# Patient Record
Sex: Female | Born: 1995 | Race: Black or African American | Hispanic: No | Marital: Single | Smoking: Never smoker
Health system: Southern US, Community
[De-identification: ages and names within clinical notes are randomized; demographics above are authoritative.]

## PROBLEM LIST (undated history)

## (undated) ENCOUNTER — Inpatient Hospital Stay (HOSPITAL_COMMUNITY): Payer: Self-pay

## (undated) DIAGNOSIS — Z789 Other specified health status: Secondary | ICD-10-CM

## (undated) HISTORY — PX: NO PAST SURGERIES: SHX2092

---

## 2017-12-01 ENCOUNTER — Inpatient Hospital Stay (HOSPITAL_COMMUNITY): Payer: Medicaid Other

## 2017-12-01 ENCOUNTER — Encounter (HOSPITAL_COMMUNITY): Payer: Self-pay

## 2017-12-01 ENCOUNTER — Inpatient Hospital Stay (HOSPITAL_COMMUNITY)
Admission: AD | Admit: 2017-12-01 | Discharge: 2017-12-01 | Disposition: A | Discharge status: Home / Self Care | Payer: Medicaid Other | Source: Ambulatory Visit | Attending: Obstetrics and Gynecology | Admitting: Obstetrics and Gynecology

## 2017-12-01 ENCOUNTER — Other Ambulatory Visit: Payer: Self-pay

## 2017-12-01 DIAGNOSIS — O209 Hemorrhage in early pregnancy, unspecified: Secondary | ICD-10-CM | POA: Insufficient documentation

## 2017-12-01 DIAGNOSIS — Z3A01 Less than 8 weeks gestation of pregnancy: Secondary | ICD-10-CM | POA: Diagnosis not present

## 2017-12-01 DIAGNOSIS — O200 Threatened abortion: Secondary | ICD-10-CM

## 2017-12-01 DIAGNOSIS — N939 Abnormal uterine and vaginal bleeding, unspecified: Secondary | ICD-10-CM | POA: Diagnosis present

## 2017-12-01 HISTORY — DX: Other specified health status: Z78.9

## 2017-12-01 LAB — URINALYSIS, ROUTINE W REFLEX MICROSCOPIC
BILIRUBIN URINE: NEGATIVE
Glucose, UA: NEGATIVE mg/dL
Ketones, ur: NEGATIVE mg/dL
NITRITE: NEGATIVE
PH: 5 (ref 5.0–8.0)
Protein, ur: NEGATIVE mg/dL
SPECIFIC GRAVITY, URINE: 1.025 (ref 1.005–1.030)

## 2017-12-01 LAB — HCG, QUANTITATIVE, PREGNANCY: HCG, BETA CHAIN, QUANT, S: 23384 m[IU]/mL — AB (ref <5)

## 2017-12-01 LAB — CBC
HEMATOCRIT: 38.3 % (ref 36.0–46.0)
HEMOGLOBIN: 13.3 g/dL (ref 12.0–15.0)
MCH: 31.8 pg (ref 26.0–34.0)
MCHC: 34.7 g/dL (ref 30.0–36.0)
MCV: 91.6 fL (ref 78.0–100.0)
Platelets: 345 10*3/uL (ref 150–400)
RBC: 4.18 MIL/uL (ref 3.87–5.11)
RDW: 13.9 % (ref 11.5–15.5)
WBC: 9.4 10*3/uL (ref 4.0–10.5)

## 2017-12-01 LAB — POCT PREGNANCY, URINE: Preg Test, Ur: POSITIVE — AB

## 2017-12-01 LAB — ABO/RH: ABO/RH(D): O POS

## 2017-12-01 NOTE — MAU Provider Note (Signed)
History   G1 early pregnancy in with vaginal spotting that started yesterday, denies pain. Last sex 1 week ago.  CSN: [id number]  Arrival date & time 12/01/17  1035   None     Chief Complaint  Patient presents with  . Vaginal Bleeding    HPI  Past Medical History:  Diagnosis Date  . Medical history non-contributory     Past Surgical History:  Procedure Laterality Date  . NO PAST SURGERIES      Family History  Problem Relation Age of Onset  . Cancer Mother     Social History   Tobacco Use  . Smoking status: Never Smoker  . Smokeless tobacco: Never Used  Substance Use Topics  . Alcohol use: No    Frequency: Never  . Drug use: No    OB History    Gravida Para Term Preterm AB Living   1             SAB TAB Ectopic Multiple Live Births                  Review of Systems  Constitutional: Negative.   HENT: Negative.   Eyes: Negative.   Respiratory: Negative.   Gastrointestinal: Negative.   Endocrine: Negative.   Genitourinary: Positive for vaginal bleeding.  Skin: Negative.   Allergic/Immunologic: Negative.   Neurological: Negative.   Hematological: Negative.   Psychiatric/Behavioral: Negative.     Allergies  Patient has no known allergies.  Home Medications    There were no vitals taken for this visit.  Physical Exam  Constitutional: She is oriented to person, place, and time. She appears well-developed and well-nourished.  HENT:  Head: Normocephalic.  Eyes: Pupils are equal, round, and reactive to light.  Cardiovascular: Normal rate, regular rhythm, normal heart sounds and intact distal pulses.  Pulmonary/Chest: Effort normal and breath sounds normal.  Abdominal: Soft. Bowel sounds are normal.  Genitourinary: Vagina normal and uterus normal.  Musculoskeletal: Normal range of motion.  Neurological: She is alert and oriented to person, place, and time. She has normal reflexes.  Skin: Skin is warm and dry.  Psychiatric: She has a normal mood  and affect. Her behavior is normal. Judgment and thought content normal.    MAU Course  Procedures (including critical care time)  Labs Reviewed  URINALYSIS, ROUTINE W REFLEX MICROSCOPIC - Abnormal; Notable for the following components:      Result Value   APPearance HAZY (*)    Hgb urine dipstick SMALL (*)    Leukocytes, UA SMALL (*)    Bacteria, UA RARE (*)    Squamous Epithelial / LPF 0-5 (*)    All other components within normal limits  HCG, QUANTITATIVE, PREGNANCY - Abnormal; Notable for the following components:   hCG, Beta Chain, Quant, S 23,384 (*)    All other components within normal limits  POCT PREGNANCY, URINE - Abnormal; Notable for the following components:   Preg Test, Ur POSITIVE (*)    All other components within normal limits  CBC  ABO/RH   No results found.   1. Bleeding in early pregnancy       MDM  VSS, spotty amt dark brown bleeding. Pelvic rest. U/s shows viable IUP. Will d/c home with precautions

## 2017-12-01 NOTE — MAU Note (Addendum)
G1 early pregnant. Presents to triage for spot bleeding that started yesterday  Last intercourse: last week  Last LMP unsure: 10/25/17. Date approx by health dept.  [facility]   1138: New orders received. lab at [facility]  1430: d/c instructions given with pt understanding. Pt left unit via ambulatory

## 2017-12-01 NOTE — Discharge Instructions (Signed)
Vaginal Bleeding During Pregnancy, First Trimester °A small amount of bleeding (spotting) from the vagina is common in early pregnancy. Sometimes the bleeding is normal and is not a problem, and sometimes it is a sign of something serious. Be sure to tell your doctor about any bleeding from your vagina right away. °Follow these instructions at home: °· Watch your condition for any changes. °· Follow your doctor's instructions about how active you can be. °· If you are on bed rest: °? You may need to stay in bed and only get up to use the bathroom. °? You may be allowed to do some activities. °? If you need help, make plans for someone to help you. °· Write down: °? The number of pads you use each day. °? How often you change pads. °? How soaked (saturated) your pads are. °· Do not use tampons. °· Do not douche. °· Do not have sex or orgasms until your doctor says it is okay. °· If you pass any tissue from your vagina, save the tissue so you can show it to your doctor. °· Only take medicines as told by your doctor. °· Do not take aspirin because it can make you bleed. °· Keep all follow-up visits as told by your doctor. °Contact a doctor if: °· You bleed from your vagina. °· You have cramps. °· You have labor pains. °· You have a fever that does not go away after you take medicine. °Get help right away if: °· You have very bad cramps in your back or belly (abdomen). °· You pass large clots or tissue from your vagina. °· You bleed more. °· You feel light-headed or weak. °· You pass out (faint). °· You have chills. °· You are leaking fluid or have a gush of fluid from your vagina. °· You pass out while pooping (having a bowel movement). °This information is not intended to replace advice given to you by your health care provider. Make sure you discuss any questions you have with your health care provider. °Document Released: 01/25/2014 Document Revised: 02/16/2016 Document Reviewed: 05/18/2013 °Elsevier Interactive  Patient Education © 2018 Elsevier Inc. ° °

## 2017-12-10 ENCOUNTER — Inpatient Hospital Stay (HOSPITAL_COMMUNITY)
Admission: AD | Admit: 2017-12-10 | Discharge: 2017-12-10 | Disposition: A | Discharge status: Home / Self Care | Payer: Medicaid Other | Source: Ambulatory Visit | Attending: Obstetrics & Gynecology | Admitting: Obstetrics & Gynecology

## 2017-12-10 ENCOUNTER — Encounter: Payer: Self-pay | Admitting: *Deleted

## 2017-12-10 ENCOUNTER — Other Ambulatory Visit: Payer: Self-pay

## 2017-12-10 ENCOUNTER — Encounter (HOSPITAL_COMMUNITY): Payer: Self-pay

## 2017-12-10 DIAGNOSIS — Z79899 Other long term (current) drug therapy: Secondary | ICD-10-CM | POA: Insufficient documentation

## 2017-12-10 DIAGNOSIS — Z3A01 Less than 8 weeks gestation of pregnancy: Secondary | ICD-10-CM

## 2017-12-10 DIAGNOSIS — O219 Vomiting of pregnancy, unspecified: Secondary | ICD-10-CM

## 2017-12-10 DIAGNOSIS — Z809 Family history of malignant neoplasm, unspecified: Secondary | ICD-10-CM | POA: Insufficient documentation

## 2017-12-10 LAB — URINALYSIS, ROUTINE W REFLEX MICROSCOPIC
BACTERIA UA: NONE SEEN
Bilirubin Urine: NEGATIVE
Glucose, UA: NEGATIVE mg/dL
Hgb urine dipstick: NEGATIVE
KETONES UR: NEGATIVE mg/dL
Nitrite: NEGATIVE
PH: 6 (ref 5.0–8.0)
PROTEIN: NEGATIVE mg/dL
Specific Gravity, Urine: 1.01 (ref 1.005–1.030)

## 2017-12-10 MED ORDER — ONDANSETRON 8 MG PO TBDP
8.0000 mg | ORAL_TABLET | Freq: Once | ORAL | Status: AC
  Administered 2017-12-10: 8 mg via ORAL
  Filled 2017-12-10: qty 1

## 2017-12-10 MED ORDER — ONDANSETRON 4 MG PO TBDP: 4.0000 mg | ORAL_TABLET | Freq: Three times a day (TID) | ORAL | 0 refills | Status: DC | PRN

## 2017-12-10 NOTE — Discharge Instructions (Signed)

## 2017-12-10 NOTE — MAU Provider Note (Addendum)
Patient Caroline Gutierrez is a 22 y.o. G1P0 At 8324w4d here with complaints of nausea  that has been on-going and now a recent bout of vomiting last night at 10 pm. She threw up 3-4 times, all in a row after eating a hot dog at 10:01pm.  She has a documented IUP on ultrasound from 12-01-2017/  History     CSN: [id number]  Arrival date and time: 12/10/17 [id number]  Chief Complaint  Patient presents with  . Emesis  . Nausea   Emesis   This is a new problem. The current episode started yesterday. The problem occurs 2 to 4 times per day. The problem has been resolved (last time she threw up was 10pm. ). The emesis has an appearance of stomach contents. There has been no fever. Pertinent negatives include no abdominal pain, chills, coughing, diarrhea, fever or headaches. Treatments tried: tried ginger ale but it didn't help.    OB History    Gravida Para Term Preterm AB Living   1             SAB TAB Ectopic Multiple Live Births                  Past Medical History:  Diagnosis Date  . Medical history non-contributory     Past Surgical History:  Procedure Laterality Date  . NO PAST SURGERIES      Family History  Problem Relation Age of Onset  . Cancer Mother     Social History   Tobacco Use  . Smoking status: Never Smoker  . Smokeless tobacco: Never Used  Substance Use Topics  . Alcohol use: No    Frequency: Never  . Drug use: No    Allergies: No Known Allergies  Medications Prior to Admission  Medication Sig Dispense Refill Last Dose  . Prenatal Vit-Fe Fumarate-FA (PRENATAL MULTIVITAMIN) TABS tablet Take 1 tablet by mouth daily at 12 noon.   Past Week at Unknown time    Review of Systems  Constitutional: Negative for chills and fever.  HENT: Negative.   Respiratory: Negative.  Negative for cough.   Cardiovascular: Negative.   Gastrointestinal: Positive for nausea and vomiting. Negative for abdominal pain and diarrhea.  Genitourinary: Negative.  Negative for difficulty  urinating and pelvic pain.  Neurological: Negative.  Negative for headaches.  Psychiatric/Behavioral: Negative.    Physical Exam   Blood pressure 98/61, pulse 67, temperature 99.2 F (37.3 C), temperature source Oral, resp. rate 16, height 5\' 4"  (1.626 m), weight 102 lb (46.3 kg), last menstrual period 10/02/2017, SpO2 100 %.  Physical Exam  Constitutional: She is oriented to person, place, and time. She appears well-developed.  HENT:  Head: Normocephalic.  Neck: Normal range of motion.  Cardiovascular: Normal rate.  Respiratory: Effort normal. No respiratory distress.  GI: Soft. She exhibits no distension and no mass. There is no tenderness. There is no rebound and no guarding.  Musculoskeletal: Normal range of motion.  Neurological: She is alert and oriented to person, place, and time. She has normal reflexes.  Skin: Skin is warm and dry.   Results for orders placed or performed during the hospital encounter of 12/10/17 (from the past 24 hour(s))  Urinalysis, Routine w reflex microscopic     Status: Abnormal   Collection Time: 12/10/17  8:00 AM  Result Value Ref Range   Color, Urine YELLOW YELLOW   APPearance CLEAR CLEAR   Specific Gravity, Urine 1.010 1.005 - 1.030   pH 6.0 5.0 -  8.0   Glucose, UA NEGATIVE NEGATIVE mg/dL   Hgb urine dipstick NEGATIVE NEGATIVE   Bilirubin Urine NEGATIVE NEGATIVE   Ketones, ur NEGATIVE NEGATIVE mg/dL   Protein, ur NEGATIVE NEGATIVE mg/dL   Nitrite NEGATIVE NEGATIVE   Leukocytes, UA SMALL (A) NEGATIVE   RBC / HPF 0-5 0 - 5 RBC/hpf   WBC, UA 0-5 0 - 5 WBC/hpf   Bacteria, UA NONE SEEN NONE SEEN   Squamous Epithelial / LPF 0-5 (A) NONE SEEN   Mucus PRESENT    MAU Course  Procedures  MDM -CMP: not done as patient has not had excessive vomiting, thus hypokalemia unlikely. -Zofran ODT.  She says that she doesn't have a lot of food in her house because she doesn't have her food stamps so she has been limiting her food intake. Patient knows of  a food pantry, she will go with her boyfriend's mom.   Charlesetta Garibaldi Kooistra 12/10/2017, 7:47 AM   Tolerating po. No emesis. Stable for discharge home.  Assessment and Plan   1. [redacted] weeks gestation of pregnancy   2. Nausea/vomiting in pregnancy    Discharge home Follow up with OB provider of choice to start care Rx Zofran  Allergies as of 12/10/2017   No Known Allergies     Medication List    TAKE these medications   ondansetron 4 MG disintegrating tablet Commonly known as:  ZOFRAN ODT Take 1 tablet (4 mg total) by mouth every 8 (eight) hours as needed for nausea or vomiting.   prenatal multivitamin Tabs tablet Take 1 tablet by mouth daily at 12 noon.      Donette Larry, CNM  12/10/2017 9:21 AM

## 2017-12-10 NOTE — MAU Note (Signed)
Pt reports nasuea since positive preg, vomiting off/on. Dizzy at times. Last time she vomited was last pm

## 2017-12-10 NOTE — MAU Note (Signed)
Pt. Reports no nausea/vomiting.

## 2017-12-10 NOTE — MAU Note (Signed)
Pt. Given crackers and drink, tolerated well, no emesis. Need referrals for OB care.

## 2018-01-07 ENCOUNTER — Encounter: Payer: Self-pay | Admitting: *Deleted

## 2018-01-07 ENCOUNTER — Ambulatory Visit (INDEPENDENT_AMBULATORY_CARE_PROVIDER_SITE_OTHER): Payer: Medicaid Other

## 2018-01-07 ENCOUNTER — Other Ambulatory Visit (HOSPITAL_COMMUNITY)
Admission: RE | Admit: 2018-01-07 | Discharge: 2018-01-07 | Disposition: A | Discharge status: Home / Self Care | Payer: Medicaid Other | Source: Ambulatory Visit | Attending: Obstetrics and Gynecology | Admitting: Obstetrics and Gynecology

## 2018-01-07 VITALS — BP 103/68 | HR 68 | Wt 109.0 lb

## 2018-01-07 DIAGNOSIS — Z3490 Encounter for supervision of normal pregnancy, unspecified, unspecified trimester: Secondary | ICD-10-CM | POA: Insufficient documentation

## 2018-01-07 DIAGNOSIS — Z23 Encounter for immunization: Secondary | ICD-10-CM

## 2018-01-07 LAB — POCT URINALYSIS DIP (DEVICE)
BILIRUBIN URINE: NEGATIVE
Glucose, UA: NEGATIVE mg/dL
HGB URINE DIPSTICK: NEGATIVE
KETONES UR: NEGATIVE mg/dL
Nitrite: NEGATIVE
PH: 6 (ref 5.0–8.0)
Protein, ur: NEGATIVE mg/dL
Specific Gravity, Urine: 1.025 (ref 1.005–1.030)
Urobilinogen, UA: 0.2 mg/dL (ref 0.0–1.0)

## 2018-01-07 NOTE — Progress Notes (Addendum)
New OB packet given  Flu Vaccine given  Anatomy OB US scheduled June 11th @ 1015.  Pt notified.  [facility]

## 2018-01-08 LAB — GC/CHLAMYDIA PROBE AMP (~~LOC~~) NOT AT ARMC
Chlamydia: NEGATIVE
NEISSERIA GONORRHEA: NEGATIVE

## 2018-01-09 LAB — CULTURE, OB URINE

## 2018-01-09 LAB — URINE CULTURE, OB REFLEX

## 2018-01-14 ENCOUNTER — Ambulatory Visit (INDEPENDENT_AMBULATORY_CARE_PROVIDER_SITE_OTHER): Payer: Medicaid Other | Admitting: Obstetrics and Gynecology

## 2018-01-14 ENCOUNTER — Encounter: Payer: Self-pay | Admitting: Obstetrics and Gynecology

## 2018-01-14 ENCOUNTER — Other Ambulatory Visit (HOSPITAL_COMMUNITY)
Admission: RE | Admit: 2018-01-14 | Discharge: 2018-01-14 | Disposition: A | Discharge status: Home / Self Care | Payer: Medicaid Other | Source: Ambulatory Visit | Attending: Obstetrics and Gynecology | Admitting: Obstetrics and Gynecology

## 2018-01-14 VITALS — BP 117/70 | HR 74 | Wt 113.9 lb

## 2018-01-14 DIAGNOSIS — Z3490 Encounter for supervision of normal pregnancy, unspecified, unspecified trimester: Secondary | ICD-10-CM | POA: Diagnosis present

## 2018-01-14 DIAGNOSIS — Z3A00 Weeks of gestation of pregnancy not specified: Secondary | ICD-10-CM | POA: Diagnosis not present

## 2018-01-14 DIAGNOSIS — B373 Candidiasis of vulva and vagina: Secondary | ICD-10-CM

## 2018-01-14 DIAGNOSIS — B3731 Acute candidiasis of vulva and vagina: Secondary | ICD-10-CM

## 2018-01-14 DIAGNOSIS — Z3401 Encounter for supervision of normal first pregnancy, first trimester: Secondary | ICD-10-CM

## 2018-01-14 MED ORDER — MICONAZOLE NITRATE 2 % VA CREA: 1.0000 | TOPICAL_CREAM | Freq: Every day | VAGINAL | 0 refills | Status: DC

## 2018-01-14 MED ORDER — PRENATAL VITAMINS 0.8 MG PO TABS: 1.0000 | ORAL_TABLET | Freq: Every day | ORAL | 12 refills | Status: DC

## 2018-01-14 NOTE — Progress Notes (Signed)
New OB Note  01/14/2018   CC:  Chief Complaint  Patient presents with  . Initial Prenatal Visit    Transfer of Care Patient: no  History of Present Illness: Caroline Gutierrez is a 22 y.o. G1P0000 at [redacted]w[redacted]d by early ultrasound being seen today for her first obstetrical visit. Her obstetrical history is significant for nothing. This was a unplanned pregnancy. Relationship with FOB: in a relationship; living together. Patient does intend to breast feed. Patient plans unsure for contraception after completion of pregnancy. Pregnancy history fully reviewed.  Her periods were: Regular  She was using no method when she conceived.  She has Negative signs or symptoms of nausea/vomiting of pregnancy. She has Negative signs or symptoms of miscarriage or preterm labor No recent travel.  Patient reports no complaints.   ROS: A 12-point review of systems was performed and negative, except as stated in the above HPI.   OBGYN History: OB History  Gravida Para Term Preterm AB Living  1 0 0 0 0 0  SAB TAB Ectopic Multiple Live Births  0 0 0 0 0    # Outcome Date GA Lbr Len/2nd Weight Sex Delivery Anes PTL Lv  1 Current             GYN Hx: Never had a pap smear  Past Medical History: Past Medical History:  Diagnosis Date  . Medical history non-contributory     Past Surgical History: Past Surgical History:  Procedure Laterality Date  . NO PAST SURGERIES      Family History:  Family History  Problem Relation Age of Onset  . Cancer Mother     Social History:  Social History   Socioeconomic History  . Marital status: Single    Spouse name: Not on file  . Number of children: Not on file  . Years of education: Not on file  . Highest education level: Not on file  Occupational History  . Not on file  Social Needs  . Financial resource strain: Not on file  . Food insecurity:    Worry: Not on file    Inability: Not on file  . Transportation needs:    Medical: Not on  file    Non-medical: Not on file  Tobacco Use  . Smoking status: Never Smoker  . Smokeless tobacco: Never Used  Substance and Sexual Activity  . Alcohol use: No    Frequency: Never  . Drug use: No  . Sexual activity: Not Currently    Birth control/protection: None  Lifestyle  . Physical activity:    Days per week: Not on file    Minutes per session: Not on file  . Stress: Not on file  Relationships  . Social connections:    Talks on phone: Not on file    Gets together: Not on file    Attends religious service: Not on file    Active member of club or organization: Not on file    Attends meetings of clubs or organizations: Not on file    Relationship status: Not on file  . Intimate partner violence:    Fear of current or ex partner: Not on file    Emotionally abused: Not on file    Physically abused: Not on file    Forced sexual activity: Not on file  Other Topics Concern  . Not on file  Social History Narrative  . Not on file    Allergy: No Known Allergies  Current Outpatient  Medications:  Current Outpatient Medications:  .  Prenatal Vit-Fe Fumarate-FA (PRENATAL MULTIVITAMIN) TABS tablet, Take 1 tablet by mouth daily at 12 noon., Disp: , Rfl:  .  ondansetron (ZOFRAN ODT) 4 MG disintegrating tablet, Take 1 tablet (4 mg total) by mouth every 8 (eight) hours as needed for nausea or vomiting. (Patient not taking: Reported on 01/07/2018), Disp: 20 tablet, Rfl: 0  Physical Exam:   BP 117/70   Pulse 74   Wt 113 lb 14.4 oz (51.7 kg)   LMP 10/02/2017   BMI 19.55 kg/m  Body mass index is 19.55 kg/m. Contractions: Not present Vag. Bleeding: None. FHTs: 154  General appearance: Well nourished, well developed female in no acute distress.  Neck:  Supple, normal appearance, and no thyromegaly  Cardiovascular: regular rate and rhythm Respiratory:  Clear to auscultation bilateral. Normal respiratory effort Abdomen: positive bowel sounds and no masses, hernias; diffusely non  tender to palpation, non distended Breasts: breasts appear normal, no suspicious masses, no skin or nipple changes or axillary nodes. Genitalia:  Normal introitus for age, no external lesions, thick clumpy whit vaginal discharge, mucosa pink and moist, no vaginal or cervical lesions, no vaginal atrophy, friaility of cervix, no hemorrhage, normal uterus size and position, no adnexal masses or tenderness Neuro/Psych:  Normal mood and affect.  Skin:  Warm and dry.  Lymphatic:  No inguinal lymphadenopathy.    Assessment/Plan: G1P0000 7981w0d  1. Encounter for supervision of low-risk pregnancy, antepartum Doing well. Reviewed initial labs that patient had drawn prior to encounter - normal. Genetic testing results pending. Rx for prenatal vitamins.  - Cytology - PAP  2. Candida vaginitis Diagnosed by exam. Will treat with Miconazole.   Ultrasound discussed; fetal anatomic survey: ordered. Problem list reviewed and updated. The nature of [place] [facility] with multiple MDs and other Advanced Practice Providers was explained to patient; also emphasized that residents, students are part of our team. Routine obstetric precautions reviewed. Return in about 1 month (around 02/11/2018) for ob visit.    Caryl AdaJazma Clelia Trabucco, DO OB Fellow [facility]

## 2018-01-14 NOTE — Patient Instructions (Signed)
First Trimester of Pregnancy The first trimester of pregnancy is from week 1 until the end of week 13 (months 1 through 3). During this time, your baby will begin to develop inside you. At 6-8 weeks, the eyes and face are formed, and the heartbeat can be seen on ultrasound. At the end of 12 weeks, all the baby's organs are formed. Prenatal care is all the medical care you receive before the birth of your baby. Make sure you get good prenatal care and follow all of your doctor's instructions. Follow these instructions at home: Medicines  Take over-the-counter and prescription medicines only as told by your doctor. Some medicines are safe and some medicines are not safe during pregnancy.  Take a prenatal vitamin that contains at least 600 micrograms (mcg) of folic acid.  If you have trouble pooping (constipation), take medicine that will make your stool soft (stool softener) if your doctor approves. Eating and drinking  Eat regular, healthy meals.  Your doctor will tell you the amount of weight gain that is right for you.  Avoid raw meat and uncooked cheese.  If you feel sick to your stomach (nauseous) or throw up (vomit): ? Eat 4 or 5 small meals a day instead of 3 large meals. ? Try eating a few soda crackers. ? Drink liquids between meals instead of during meals.  To prevent constipation: ? Eat foods that are high in fiber, like fresh fruits and vegetables, whole grains, and beans. ? Drink enough fluids to keep your pee (urine) clear or pale yellow. Activity  Exercise only as told by your doctor. Stop exercising if you have cramps or pain in your lower belly (abdomen) or low back.  Do not exercise if it is too hot, too humid, or if you are in a place of great height (high altitude).  Try to avoid standing for long periods of time. Move your legs often if you must stand in one place for a long time.  Avoid heavy lifting.  Wear low-heeled shoes. Sit and stand up straight.  You  can have sex unless your doctor tells you not to. Relieving pain and discomfort  Wear a good support bra if your breasts are sore.  Take warm water baths (sitz baths) to soothe pain or discomfort caused by hemorrhoids. Use hemorrhoid cream if your doctor says it is okay.  Rest with your legs raised if you have leg cramps or low back pain.  If you have puffy, bulging veins (varicose veins) in your legs: ? Wear support hose or compression stockings as told by your doctor. ? Raise (elevate) your feet for 15 minutes, 3-4 times a day. ? Limit salt in your food. Prenatal care  Schedule your prenatal visits by the twelfth week of pregnancy.  Write down your questions. Take them to your prenatal visits.  Keep all your prenatal visits as told by your doctor. This is important. Safety  Wear your seat belt at all times when driving.  Make a list of emergency phone numbers. The list should include numbers for family, friends, the hospital, and police and fire departments. General instructions  Ask your doctor for a referral to a local prenatal class. Begin classes no later than at the start of month 6 of your pregnancy.  Ask for help if you need counseling or if you need help with nutrition. Your doctor can give you advice or tell you where to go for help.  Do not use hot tubs, steam rooms, or   saunas.  Do not douche or use tampons or scented sanitary pads.  Do not cross your legs for long periods of time.  Avoid all herbs and alcohol. Avoid drugs that are not approved by your doctor.  Do not use any tobacco products, including cigarettes, chewing tobacco, and electronic cigarettes. If you need help quitting, ask your doctor. You may get counseling or other support to help you quit.  Avoid cat litter boxes and soil used by cats. These carry germs that can cause birth defects in the baby and can cause a loss of your baby (miscarriage) or stillbirth.  Visit your dentist. At home, brush  your teeth with a soft toothbrush. Be gentle when you floss. Contact a doctor if:  You are dizzy.  You have mild cramps or pressure in your lower belly.  You have a nagging pain in your belly area.  You continue to feel sick to your stomach, you throw up, or you have watery poop (diarrhea).  You have a bad smelling fluid coming from your vagina.  You have pain when you pee (urinate).  You have increased puffiness (swelling) in your face, hands, legs, or ankles. Get help right away if:  You have a fever.  You are leaking fluid from your vagina.  You have spotting or bleeding from your vagina.  You have very bad belly cramping or pain.  You gain or lose weight rapidly.  You throw up blood. It may look like coffee grounds.  You are around people who have German measles, fifth disease, or chickenpox.  You have a very bad headache.  You have shortness of breath.  You have any kind of trauma, such as from a fall or a car accident. Summary  The first trimester of pregnancy is from week 1 until the end of week 13 (months 1 through 3).  To take care of yourself and your unborn baby, you will need to eat healthy meals, take medicines only if your doctor tells you to do so, and do activities that are safe for you and your baby.  Keep all follow-up visits as told by your doctor. This is important as your doctor will have to ensure that your baby is healthy and growing well. This information is not intended to replace advice given to you by your health care provider. Make sure you discuss any questions you have with your health care provider. Document Released: 02/27/2008 Document Revised: 09/18/2016 Document Reviewed: 09/18/2016 Elsevier Interactive Patient Education  2017 Elsevier Inc.  

## 2018-01-15 LAB — HEMOGLOBINOPATHY EVALUATION
Ferritin: 36 ng/mL (ref 15–150)
HGB A: 97.8 % (ref 96.4–98.8)
HGB F QUANT: 0 % (ref 0.0–2.0)
HGB S: 0 %
Hgb A2 Quant: 2.2 % (ref 1.8–3.2)
Hgb C: 0 %
Hgb Solubility: NEGATIVE
Hgb Variant: 0 %

## 2018-01-15 LAB — OBSTETRIC PANEL, INCLUDING HIV
ANTIBODY SCREEN: NEGATIVE
BASOS: 0 %
Basophils Absolute: 0 10*3/uL (ref 0.0–0.2)
EOS (ABSOLUTE): 1.3 10*3/uL — ABNORMAL HIGH (ref 0.0–0.4)
EOS: 10 %
HEMATOCRIT: 38.6 % (ref 34.0–46.6)
HEMOGLOBIN: 13.3 g/dL (ref 11.1–15.9)
HIV Screen 4th Generation wRfx: NONREACTIVE
Hepatitis B Surface Ag: NEGATIVE
IMMATURE GRANULOCYTES: 0 %
Immature Grans (Abs): 0 10*3/uL (ref 0.0–0.1)
LYMPHS ABS: 2.2 10*3/uL (ref 0.7–3.1)
Lymphs: 16 %
MCH: 32.9 pg (ref 26.6–33.0)
MCHC: 34.5 g/dL (ref 31.5–35.7)
MCV: 96 fL (ref 79–97)
Monocytes Absolute: 0.9 10*3/uL (ref 0.1–0.9)
Monocytes: 7 %
NEUTROS ABS: 9 10*3/uL — AB (ref 1.4–7.0)
NEUTROS PCT: 67 %
Platelets: 374 10*3/uL (ref 150–379)
RBC: 4.04 x10E6/uL (ref 3.77–5.28)
RDW: 14.4 % (ref 12.3–15.4)
RH TYPE: POSITIVE
RPR: NONREACTIVE
Rubella Antibodies, IGG: 3.62 index (ref >0.99)
WBC: 13.4 10*3/uL — AB (ref 3.4–10.8)

## 2018-01-15 LAB — SMN1 COPY NUMBER ANALYSIS (SMA CARRIER SCREENING)

## 2018-01-15 LAB — CYSTIC FIBROSIS GENE TEST

## 2018-01-16 LAB — CYTOLOGY - PAP
CHLAMYDIA, DNA PROBE: NEGATIVE
Diagnosis: NEGATIVE
NEISSERIA GONORRHEA: NEGATIVE

## 2018-02-11 ENCOUNTER — Encounter: Payer: Medicaid Other | Admitting: Obstetrics and Gynecology

## 2018-02-13 ENCOUNTER — Encounter: Payer: Medicaid Other | Admitting: Student

## 2018-03-04 ENCOUNTER — Other Ambulatory Visit (HOSPITAL_COMMUNITY): Payer: Medicaid Other

## 2018-10-21 ENCOUNTER — Encounter (HOSPITAL_COMMUNITY): Payer: Self-pay

## 2019-11-05 IMAGING — US US OB < 14 WEEKS - US OB TV
1 series · 15 of 28 positions shown · non-contrast
Comparison: None.

CLINICAL DATA: Bleeding in early pregnancy. Quantitative beta HCG
level is [DATE]. Patient is 5 weeks and 2 days pregnant based on her
last menstrual period.

EXAM:
OBSTETRIC <14 WK US AND TRANSVAGINAL OB US
TECHNIQUE: Both transabdominal and transvaginal ultrasound examinations were
performed for complete evaluation of the gestation as well as the
maternal uterus, adnexal regions, and pelvic cul-de-sac.
Transvaginal technique was performed to assess early pregnancy.

[Series 1: us ob < 14 weeks - us ob tv · 15 of 36 slices shown]
[im 1/36]
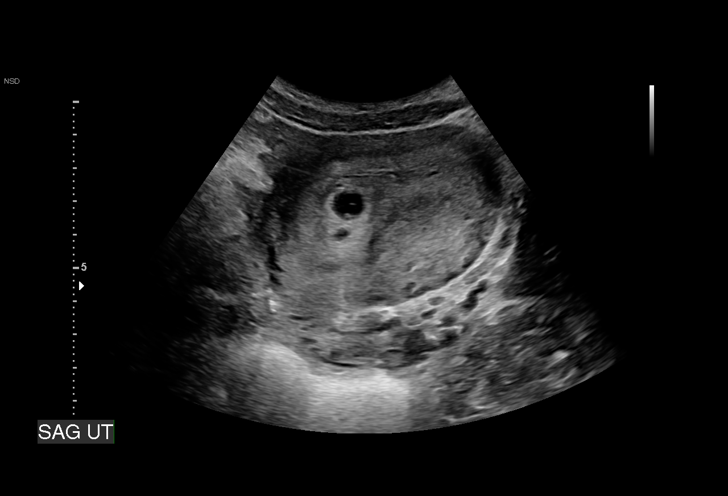
[im 3/36]
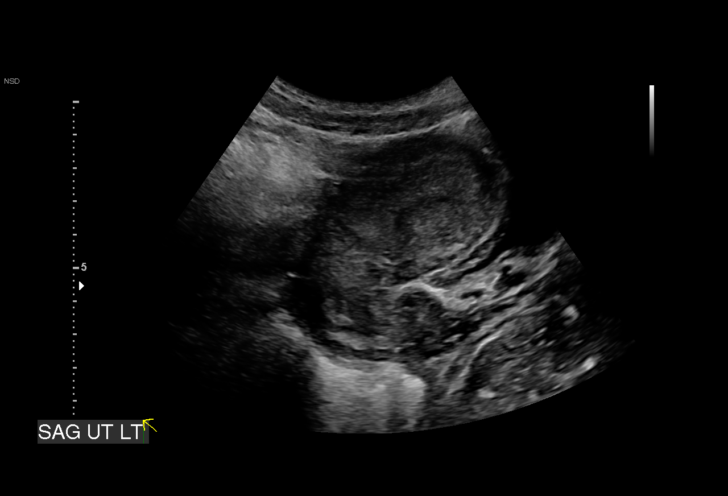
[im 6/36]
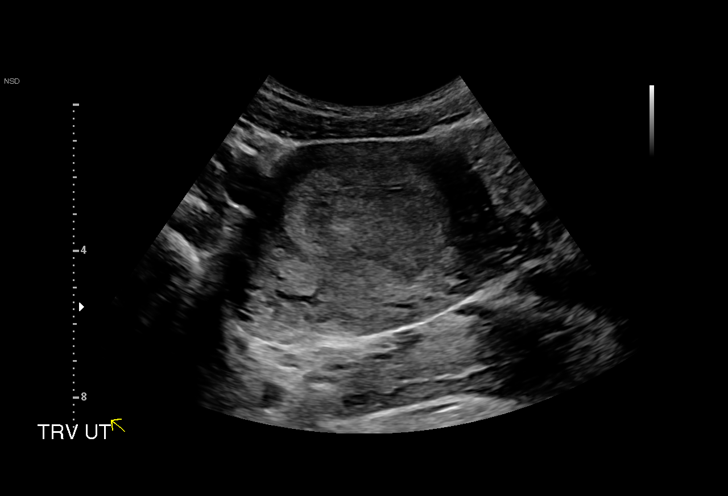
[im 8/36]
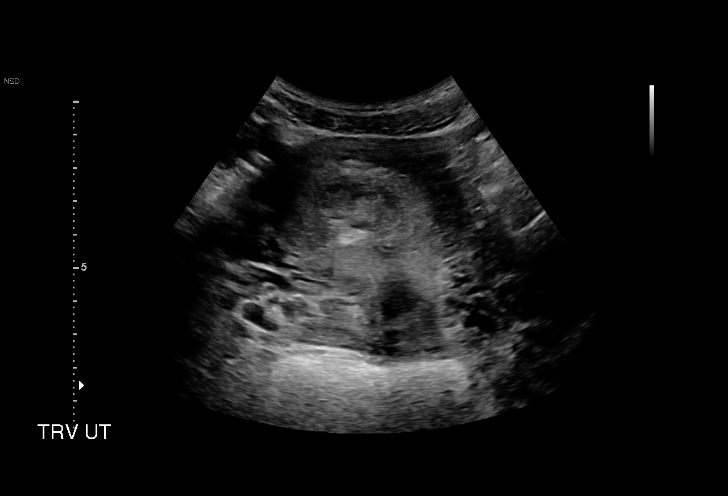
[im 11/36]
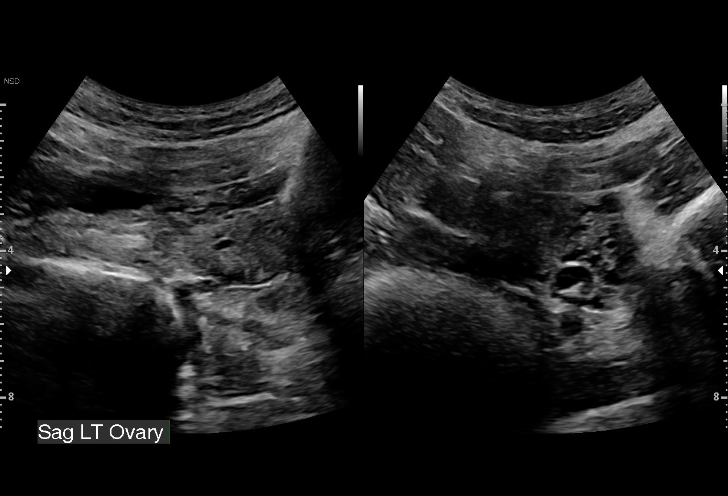
[im 13/36]
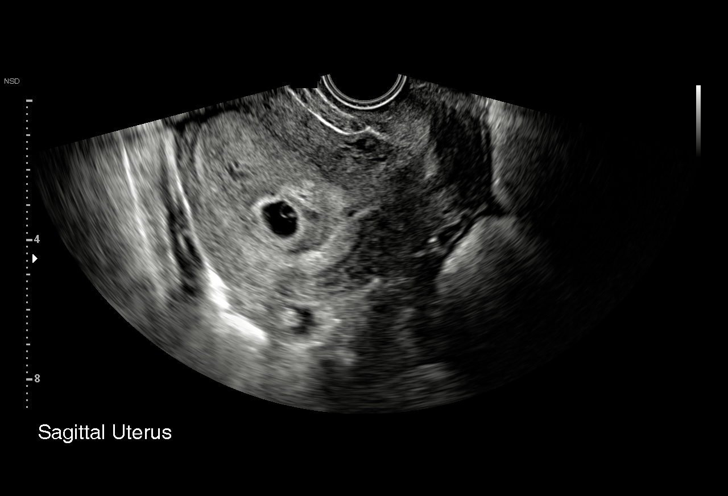
[im 16/36]
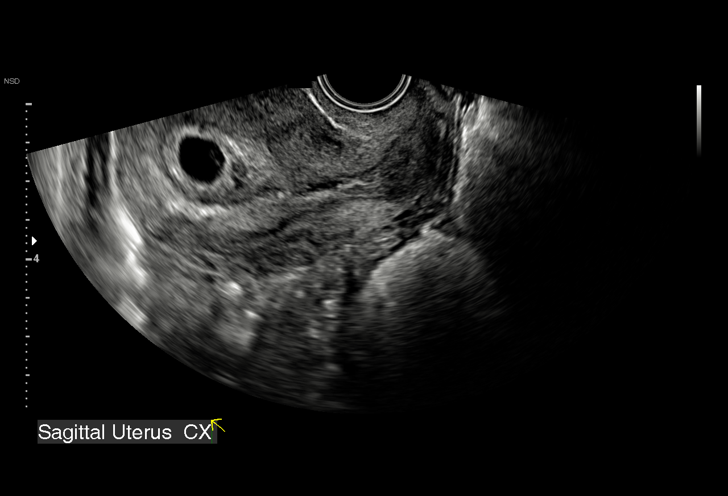
[im 19/36]
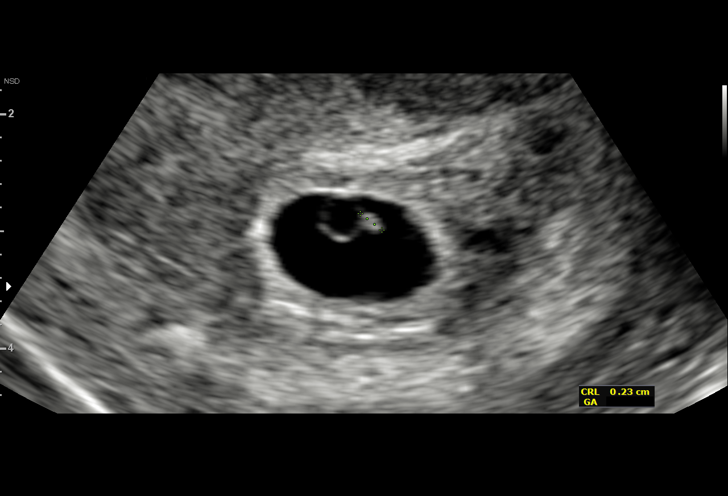
[im 20/36]
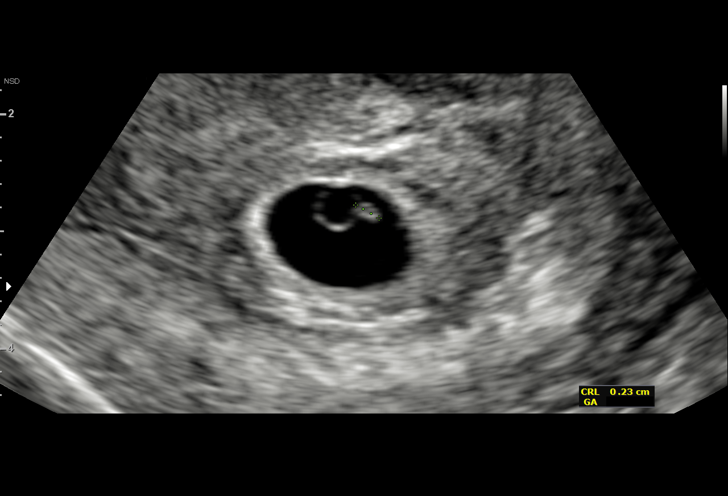
[im 23/36]
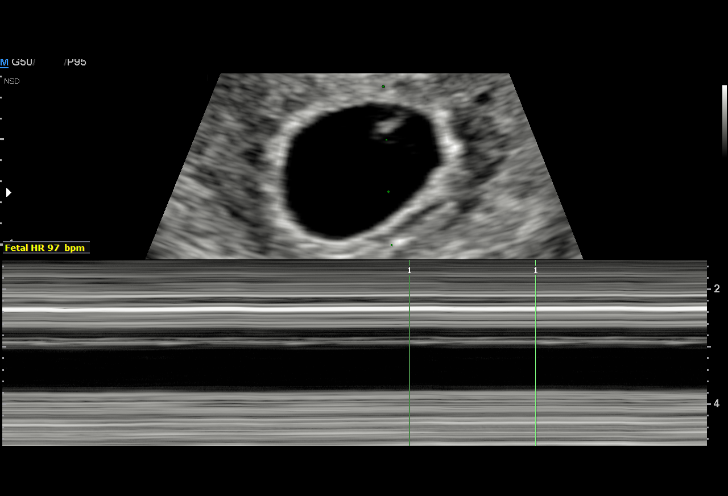
[im 25/36]
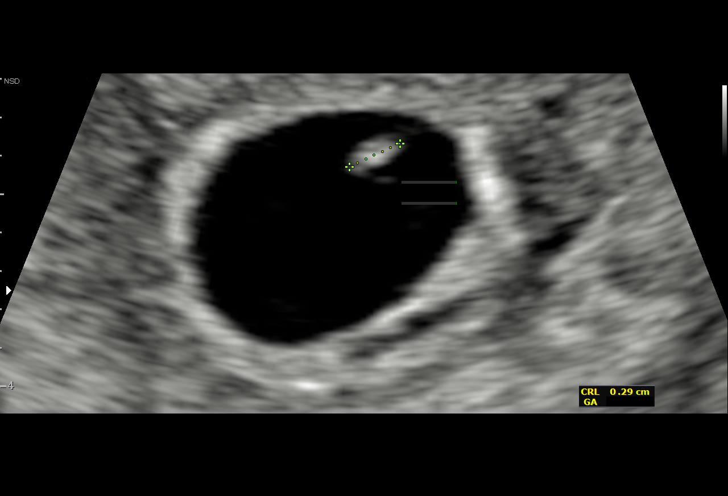
[im 28/36]
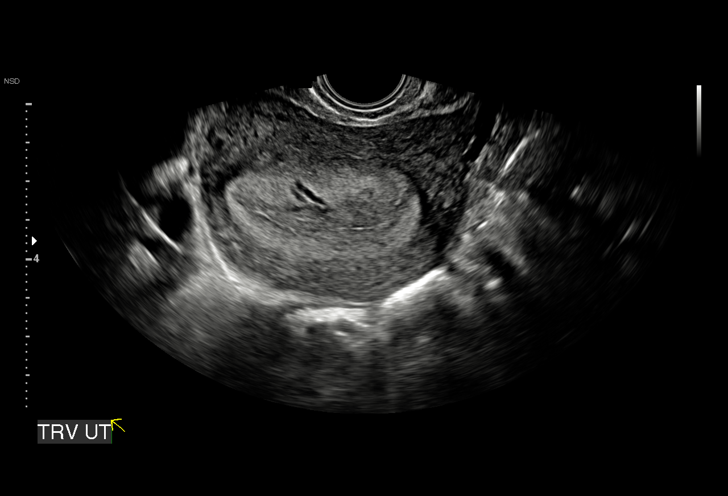
[im 30/36]
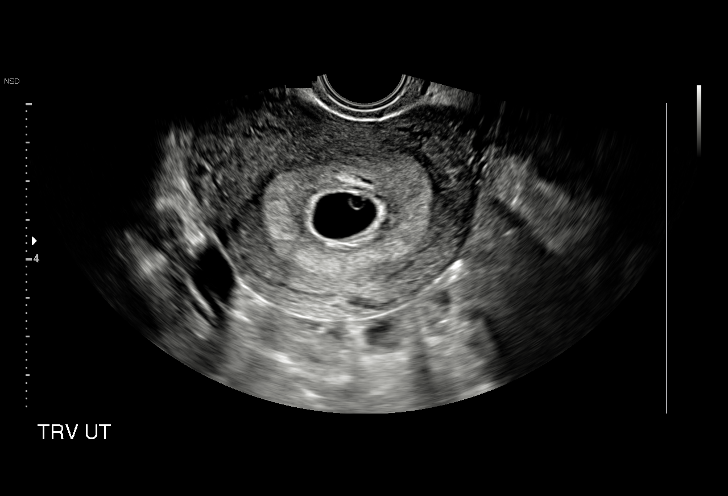
[im 33/36]
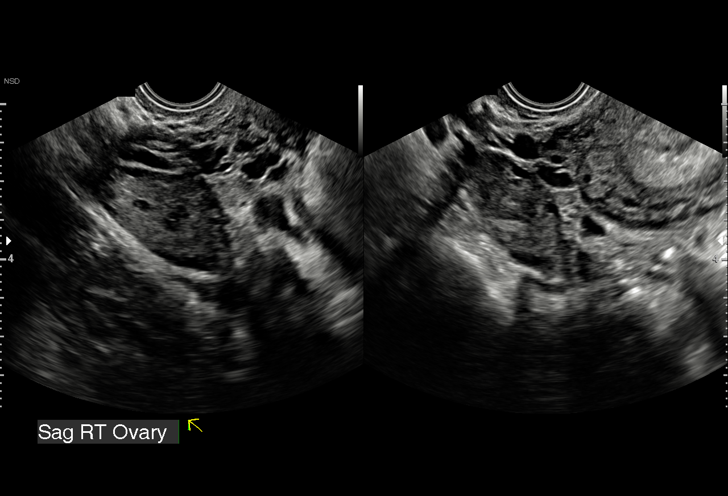
[im 36/36]
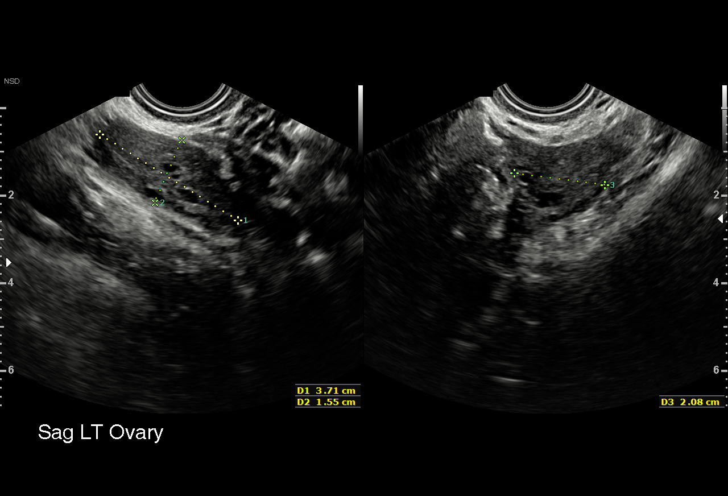

[15 of 28 positions shown; findings below may reference images not displayed]

FINDINGS: Intrauterine gestational sac: Single

Yolk sac:  Visualized.

Embryo:  Visualized.

Cardiac Activity: Visualized.

Heart Rate: 97 bpm

CRL:  2.8 mm   5 w   5 d                  US EDC: 07/29/2018

Subchorionic hemorrhage:  Small subchronic hemorrhage.

Maternal uterus/adnexae: No uterine masses. Cervix is closed. Normal
ovaries and adnexa. No pelvic free fluid.
IMPRESSION: 1. Single live intrauterine pregnancy with a measured gestational
age of 5 weeks and 5 days.
2. Small subchronic hemorrhage. No other pregnancy complication.
Normal ovaries and adnexa.

## 2021-05-19 ENCOUNTER — Ambulatory Visit (INDEPENDENT_AMBULATORY_CARE_PROVIDER_SITE_OTHER): Payer: Medicaid - Out of State

## 2021-05-19 ENCOUNTER — Ambulatory Visit (HOSPITAL_COMMUNITY)
Admission: EM | Admit: 2021-05-19 | Discharge: 2021-05-19 | Disposition: A | Discharge status: Home / Self Care | Payer: Medicaid - Out of State | Attending: Physician Assistant | Admitting: Physician Assistant

## 2021-05-19 ENCOUNTER — Encounter (HOSPITAL_COMMUNITY): Payer: Self-pay | Admitting: Emergency Medicine

## 2021-05-19 DIAGNOSIS — R0789 Other chest pain: Secondary | ICD-10-CM

## 2021-05-19 DIAGNOSIS — R0602 Shortness of breath: Secondary | ICD-10-CM

## 2021-05-19 DIAGNOSIS — M940 Chondrocostal junction syndrome [Tietze]: Secondary | ICD-10-CM

## 2021-05-19 MED ORDER — TIZANIDINE HCL 4 MG PO CAPS: 4.0000 mg | ORAL_CAPSULE | Freq: Two times a day (BID) | ORAL | 0 refills | Status: DC | PRN

## 2021-05-19 MED ORDER — PREDNISONE 20 MG PO TABS: 40.0000 mg | ORAL_TABLET | Freq: Every day | ORAL | 0 refills | Status: DC

## 2021-05-19 NOTE — Discharge Instructions (Signed)
Your x-ray was normal.  As we discussed, I think that you have a muscle strain causing your symptoms.  Please start prednisone 40 mg for 3 to 4 days.  You should not take NSAIDs including aspirin, ibuprofen/Advil, naproxen/Aleve with this medication as it can cause stomach bleeding.  I have also called in a muscle relaxer that you can take up to twice a day as needed.  This will make you sleepy so do not drive or drink alcohol taking it.  Avoid any strenuous activity.  If you have any worsening symptoms please return for reevaluation as we discussed.

## 2021-05-19 NOTE — ED Triage Notes (Signed)
Left sided chest pain underneath her left arm and across anterior chest starting one week prior. 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 Pain worsens significantly with palpation

## 2021-05-19 NOTE — ED Provider Notes (Signed)
[facility]    CSN: [id number] Arrival date & time: 05/19/21  1623      History   Chief Complaint Chief Complaint  Patient presents with   Chest Pain    HPI Caroline Gutierrez is a 25 y.o. female.   Patient presents today with a several day history of intermittent left-sided chest pain.  She denies any known injury or increase activity prior to symptom onset but does have a physically demanding job.  She reports pain is rated 10 on a 0-10 pain scale, localized to a specific area on her anterior chest wall, described as sharp, worse with palpation or certain movements of her left arm, no alleviating factors identified.  She has tried over-the-counter analgesics including aspirin without improvement of symptoms.  She does report some shortness of breath but attributes this more to pain with moving rib cage rather than true inability to breathe.  She denies any nausea, vomiting, diaphoresis, dizziness, syncope, palpitations.  She denies history of cardiovascular disease and denies any significant risk factors including hypertension, diabetes, strong family history, smoking.  She denies any association with activity other than specific movements exacerbating pain; denies any anginal symptoms.   Past Medical History:  Diagnosis Date   Medical history non-contributory     Patient Active Problem List   Diagnosis Date Noted   Supervision of low-risk pregnancy 01/07/2018    Past Surgical History:  Procedure Laterality Date   NO PAST SURGERIES      OB History     Gravida  1   Para  0   Term  0   Preterm  0   AB  0   Living  0      SAB  0   IAB  0   Ectopic  0   Multiple  0   Live Births  0            Home Medications    Prior to Admission medications   Medication Sig Start Date End Date Taking? Authorizing Provider  predniSONE (DELTASONE) 20 MG tablet Take 2 tablets (40 mg total) by mouth daily. 05/19/21  Yes Colson Barco K, PA-C  tiZANidine  (ZANAFLEX) 4 MG capsule Take 1 capsule (4 mg total) by mouth 2 (two) times daily as needed for muscle spasms. 05/19/21  Yes Jamelle Noy K, PA-C  miconazole (MONISTAT 7) 2 % vaginal cream Place 1 Applicatorful vaginally at bedtime. 01/14/18   Pincus Large, DO  ondansetron (ZOFRAN ODT) 4 MG disintegrating tablet Take 1 tablet (4 mg total) by mouth every 8 (eight) hours as needed for nausea or vomiting. Patient not taking: Reported on 01/07/2018 12/10/17   Donette Larry, CNM  Prenatal Multivit-Min-Fe-FA (PRENATAL VITAMINS) 0.8 MG tablet Take 1 tablet by mouth daily. 01/14/18   Pincus Large, DO  Prenatal Vit-Fe Fumarate-FA (PRENATAL MULTIVITAMIN) TABS tablet Take 1 tablet by mouth daily at 12 noon.    [provider]    Family History Family History  Problem Relation Age of Onset   Cancer Mother        ovarian    Social History Social History   Tobacco Use   Smoking status: Never   Smokeless tobacco: Never  Substance Use Topics   Alcohol use: No   Drug use: No     Allergies   Patient has no known allergies.   Review of Systems Review of Systems  Constitutional:  Positive for activity change. Negative for appetite change, fatigue and  fever.  Respiratory:  Negative for cough and shortness of breath.   Cardiovascular:  Positive for chest pain.  Gastrointestinal:  Negative for abdominal pain, diarrhea, nausea and vomiting.  Neurological:  Negative for dizziness, light-headedness and headaches.    Physical Exam Triage Vital Signs ED Triage Vitals [05/19/21 1641]  Enc Vitals Group     BP 111/66     Pulse Rate (!) 56     Resp 16     Temp 98.7 F (37.1 C)     Temp Source Oral     SpO2 100 %     Weight      Height      Head Circumference      Peak Flow      Pain Score 10     Pain Loc      Pain Edu?      Excl. in GC?    No data found.  Updated Vital Signs BP 111/66 (BP Location: Right Arm)   Pulse (!) 56   Temp 98.7 F (37.1 C) (Oral)   Resp 16    SpO2 100%   Visual Acuity Right Eye Distance:   Left Eye Distance:   Bilateral Distance:    Right Eye Near:   Left Eye Near:    Bilateral Near:     Physical Exam Vitals reviewed.  Constitutional:      General: She is awake. She is not in acute distress.    Appearance: Normal appearance. She is normal weight. She is not ill-appearing.     Comments: Very pleasant female appears stated age in no acute distress sitting comfortably in exam room  HENT:     Head: Normocephalic and atraumatic.  Cardiovascular:     Rate and Rhythm: Normal rate and regular rhythm.     Heart sounds: Normal heart sounds, S1 normal and S2 normal. No murmur heard. Pulmonary:     Effort: Pulmonary effort is normal.     Breath sounds: Normal breath sounds. No wheezing, rhonchi or rales.     Comments: Clear to auscultation bilaterally Chest:     Chest wall: Tenderness present. No deformity or swelling.       Comments: Tenderness palpation of her left anterior chest wall. Abdominal:     General: Bowel sounds are normal.     Palpations: Abdomen is soft.     Tenderness: There is no abdominal tenderness. There is no right CVA tenderness, left CVA tenderness, guarding or rebound.  Psychiatric:        Behavior: Behavior is cooperative.     UC Treatments / Results  Labs (all labs ordered are listed, but only abnormal results are displayed) Labs Reviewed - No data to display  EKG   Radiology DG Chest 2 View  Result Date: 05/19/2021 CLINICAL DATA:  Anterior chest wall pain and shortness of breath. EXAM: CHEST - 2 VIEW COMPARISON:  None. FINDINGS: The heart size and mediastinal contours are within normal limits. Both lungs are clear. The visualized skeletal structures are unremarkable. IMPRESSION: No active cardiopulmonary disease. Electronically Signed   By: Aram Candela M.D.   On: 05/19/2021 17:59    Procedures Procedures (including critical care time)  Medications Ordered in UC Medications - No  data to display  Initial Impression / Assessment and Plan / UC Course  I have reviewed the triage vital signs and the nursing notes.  Pertinent labs & imaging results that were available during my care of the patient were reviewed by me and  considered in my medical decision making (see chart for details).     Discussed likely musculoskeletal etiology given tenderness palpation but as patient was having some subjective shortness of breath x-ray was obtained to rule out underlying cardiopulmonary disease.  X-ray obtained was normal.  Patient was started on prednisone with instruction not to take NSAIDs with this medication due to risk of GI bleeding.  She was prescribed tizanidine to be taken up to twice a day as needed with instruction not to drive or drink alcohol while taking this medication as drowsiness is a common side effect.  Recommended she use gentle movement and avoid any strenuous activity until symptoms improve.  Discussed alarm symptoms that warrant emergent evaluation.  Strict return precautions given to which patient expressed understanding.  Final Clinical Impressions(s) / UC Diagnoses   Final diagnoses:  Chest wall pain  Costochondritis     Discharge Instructions      Your x-ray was normal.  As we discussed, I think that you have a muscle strain causing your symptoms.  Please start prednisone 40 mg for 3 to 4 days.  You should not take NSAIDs including aspirin, ibuprofen/Advil, naproxen/Aleve with this medication as it can cause stomach bleeding.  I have also called in a muscle relaxer that you can take up to twice a day as needed.  This will make you sleepy so do not drive or drink alcohol taking it.  Avoid any strenuous activity.  If you have any worsening symptoms please return for reevaluation as we discussed.     ED Prescriptions     Medication Sig Dispense Auth. Provider   predniSONE (DELTASONE) 20 MG tablet Take 2 tablets (40 mg total) by mouth daily. 8 tablet  Aliyah Abeyta K, PA-C   tiZANidine (ZANAFLEX) 4 MG capsule Take 1 capsule (4 mg total) by mouth 2 (two) times daily as needed for muscle spasms. 10 capsule Nayquan Evinger, Noberto Retort, PA-C      PDMP not reviewed this encounter.   Jeani Hawking, PA-C 05/19/21 1808

## 2021-08-13 ENCOUNTER — Other Ambulatory Visit: Payer: Self-pay

## 2021-08-13 ENCOUNTER — Emergency Department (HOSPITAL_COMMUNITY)
Admission: EM | Admit: 2021-08-13 | Discharge: 2021-08-14 | Disposition: A | Discharge status: Other Acute Inpatient Hospital | Payer: Medicaid - Out of State | Attending: Emergency Medicine | Admitting: Emergency Medicine

## 2021-08-13 DIAGNOSIS — T1491XA Suicide attempt, initial encounter: Secondary | ICD-10-CM

## 2021-08-13 DIAGNOSIS — T391X2A Poisoning by 4-Aminophenol derivatives, intentional self-harm, initial encounter: Secondary | ICD-10-CM | POA: Insufficient documentation

## 2021-08-13 DIAGNOSIS — N9489 Other specified conditions associated with female genital organs and menstrual cycle: Secondary | ICD-10-CM | POA: Diagnosis not present

## 2021-08-13 DIAGNOSIS — Z20822 Contact with and (suspected) exposure to covid-19: Secondary | ICD-10-CM | POA: Diagnosis not present

## 2021-08-13 DIAGNOSIS — F332 Major depressive disorder, recurrent severe without psychotic features: Secondary | ICD-10-CM | POA: Insufficient documentation

## 2021-08-13 LAB — CBC WITH DIFFERENTIAL/PLATELET
Abs Immature Granulocytes: 0.02 10*3/uL (ref 0.00–0.07)
Basophils Absolute: 0 10*3/uL (ref 0.0–0.1)
Basophils Relative: 0 %
Eosinophils Absolute: 0.3 10*3/uL (ref 0.0–0.5)
Eosinophils Relative: 4 %
HCT: 41.5 % (ref 36.0–46.0)
Hemoglobin: 13.5 g/dL (ref 12.0–15.0)
Immature Granulocytes: 0 %
Lymphocytes Relative: 27 %
Lymphs Abs: 2.3 10*3/uL (ref 0.7–4.0)
MCH: 31 pg (ref 26.0–34.0)
MCHC: 32.5 g/dL (ref 30.0–36.0)
MCV: 95.4 fL (ref 80.0–100.0)
Monocytes Absolute: 0.5 10*3/uL (ref 0.1–1.0)
Monocytes Relative: 6 %
Neutro Abs: 5.3 10*3/uL (ref 1.7–7.7)
Neutrophils Relative %: 63 %
Platelets: 332 10*3/uL (ref 150–400)
RBC: 4.35 MIL/uL (ref 3.87–5.11)
RDW: 13.4 % (ref 11.5–15.5)
WBC: 8.4 10*3/uL (ref 4.0–10.5)
nRBC: 0 % (ref 0.0–0.2)

## 2021-08-13 LAB — RAPID URINE DRUG SCREEN, HOSP PERFORMED
Amphetamines: NOT DETECTED
Barbiturates: NOT DETECTED
Benzodiazepines: NOT DETECTED
Cocaine: NOT DETECTED
Opiates: NOT DETECTED
Tetrahydrocannabinol: POSITIVE — AB

## 2021-08-13 LAB — COMPREHENSIVE METABOLIC PANEL
ALT: 34 U/L (ref 0–44)
AST: 23 U/L (ref 15–41)
Albumin: 4.3 g/dL (ref 3.5–5.0)
Alkaline Phosphatase: 55 U/L (ref 38–126)
Anion gap: 8 (ref 5–15)
BUN: 22 mg/dL — ABNORMAL HIGH (ref 6–20)
CO2: 23 mmol/L (ref 22–32)
Calcium: 9.2 mg/dL (ref 8.9–10.3)
Chloride: 108 mmol/L (ref 98–111)
Creatinine, Ser: 0.85 mg/dL (ref 0.44–1.00)
GFR, Estimated: >60 mL/min (ref >60)
Glucose, Bld: 83 mg/dL (ref 70–99)
Potassium: 3.7 mmol/L (ref 3.5–5.1)
Sodium: 139 mmol/L (ref 135–145)
Total Bilirubin: 1.3 mg/dL — ABNORMAL HIGH (ref 0.3–1.2)
Total Protein: 7.8 g/dL (ref 6.5–8.1)

## 2021-08-13 LAB — I-STAT BETA HCG BLOOD, ED (MC, WL, AP ONLY): I-stat hCG, quantitative: <5 m[IU]/mL (ref <5)

## 2021-08-13 LAB — RESP PANEL BY RT-PCR (FLU A&B, COVID) ARPGX2
Influenza A by PCR: NEGATIVE
Influenza B by PCR: NEGATIVE
SARS Coronavirus 2 by RT PCR: NEGATIVE

## 2021-08-13 LAB — SALICYLATE LEVEL: Salicylate Lvl: <7 mg/dL — ABNORMAL LOW (ref 7.0–30.0)

## 2021-08-13 LAB — ACETAMINOPHEN LEVEL: Acetaminophen (Tylenol), Serum: <10 ug/mL — ABNORMAL LOW (ref 10–30)

## 2021-08-13 LAB — ETHANOL: Alcohol, Ethyl (B): <10 mg/dL (ref <10)

## 2021-08-13 NOTE — ED Triage Notes (Signed)
Boyfriend called for EMS due to pt passing out. Pt states she laid down and states she took 5 (500mg ) tylenol. Pt states she is depressed but took medication because she has a headache.   Pt states she never attempted suicide.   114/66 BP 64 HR 100 Spo2 Cbg 104

## 2021-08-13 NOTE — ED Notes (Signed)
Attempted to do lab draw but unsuccessful, pt feeling dizzy.

## 2021-08-13 NOTE — ED Provider Notes (Signed)
Patient care assumed at 2300. Patient here following intentional overdose on acetaminophen and attempted self harm. Repeat acetaminophen level is negative. She has been medically cleared for psychiatric evaluation and treatment.   Tilden Fossa, MD 08/14/21 [phone]
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
## 2021-08-14 ENCOUNTER — Other Ambulatory Visit (HOSPITAL_COMMUNITY)
Admission: EM | Admit: 2021-08-14 | Discharge: 2021-08-15 | Disposition: A | Discharge status: Other Acute Inpatient Hospital | Payer: No Payment, Other | Attending: Psychiatry | Admitting: Psychiatry

## 2021-08-14 DIAGNOSIS — Z560 Unemployment, unspecified: Secondary | ICD-10-CM | POA: Insufficient documentation

## 2021-08-14 DIAGNOSIS — Z91419 Personal history of unspecified adult abuse: Secondary | ICD-10-CM | POA: Insufficient documentation

## 2021-08-14 DIAGNOSIS — Z630 Problems in relationship with spouse or partner: Secondary | ICD-10-CM | POA: Insufficient documentation

## 2021-08-14 DIAGNOSIS — T391X2A Poisoning by 4-Aminophenol derivatives, intentional self-harm, initial encounter: Secondary | ICD-10-CM | POA: Insufficient documentation

## 2021-08-14 DIAGNOSIS — F322 Major depressive disorder, single episode, severe without psychotic features: Secondary | ICD-10-CM | POA: Diagnosis not present

## 2021-08-14 DIAGNOSIS — F332 Major depressive disorder, recurrent severe without psychotic features: Secondary | ICD-10-CM | POA: Diagnosis present

## 2021-08-14 DIAGNOSIS — T1491XA Suicide attempt, initial encounter: Secondary | ICD-10-CM

## 2021-08-14 LAB — ACETAMINOPHEN LEVEL: Acetaminophen (Tylenol), Serum: <10 ug/mL — ABNORMAL LOW (ref 10–30)

## 2021-08-14 MED ORDER — MAGNESIUM HYDROXIDE 400 MG/5ML PO SUSP: 30.0000 mL | Freq: Every day | ORAL | Status: DC | PRN

## 2021-08-14 MED ORDER — HYDROXYZINE HCL 25 MG PO TABS: 25.0000 mg | ORAL_TABLET | Freq: Three times a day (TID) | ORAL | Status: DC | PRN

## 2021-08-14 MED ORDER — TRAZODONE HCL 50 MG PO TABS: 50.0000 mg | ORAL_TABLET | Freq: Every evening | ORAL | Status: DC | PRN

## 2021-08-14 MED ORDER — ALUM & MAG HYDROXIDE-SIMETH 200-200-20 MG/5ML PO SUSP: 30.0000 mL | ORAL | Status: DC | PRN

## 2021-08-14 NOTE — Progress Notes (Signed)
Patient resting in bed.  Awake.  Continue to monitor for safety.

## 2021-08-14 NOTE — ED Notes (Signed)
Pt is sleeping in the bed at present. Respirations are even and unlabored. No acute distress noted. Will continue to monitor for safety. 
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
## 2021-08-14 NOTE — Progress Notes (Signed)
Patient served IVC and ambulated independently, without issue, to [facility].  Patient teary.  Oriented to unit and unit routine.  Given towels to take a shower per her request although patient didn't shower at this time.

## 2021-08-14 NOTE — ED Notes (Signed)
Report given to [facility] RN at [facility].

## 2021-08-14 NOTE — ED Notes (Addendum)
Pt admitted to [facility] due to suicide attempt by ingesting Tylenol. Pt A&O x4, calm and cooperative. Pt denies current SI/HI/AVH. Pt tolerated admission process and skin assessment well. Pt ambulated independently to unit. Oriented to unit/staff. Pt declined offer for food/drink. No signs of acute distress noted. Will continue to monitor for safety.
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
## 2021-08-14 NOTE — Progress Notes (Signed)
Patient to nurse's station.  Stated that she wants to go home.  Explained that she wouldn't be able to discharge today.  Patient endorsed feeling hungry, but reluctant to eat.  Encouraged to go to dining room and given juice and animal crackers.  Continue to monitor for safety.

## 2021-08-14 NOTE — Progress Notes (Signed)
Patient sitting on bed.  Denied SI, HI, AVH.  Teary, sad.  Stated that she wants to go home.  Guarded, but polite.  SW in to talk with patient earlier and aware of patient request to discharge.  Continue to monitor for safety.

## 2021-08-14 NOTE — ED Notes (Signed)
Pt offered meal; declined.

## 2021-08-14 NOTE — ED Notes (Signed)
Pt received dinner. 

## 2021-08-14 NOTE — Progress Notes (Signed)
Pt is IVCed and is a transfer from [facility]. Pt is tearful and refused to respond to assessment questions. Pt also refused skin assessment. Pt declined meal and drink. Support and encouragement given. Staff will monitor for pt's safety.
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
## 2021-08-15 ENCOUNTER — Other Ambulatory Visit: Payer: Self-pay

## 2021-08-15 ENCOUNTER — Encounter (HOSPITAL_COMMUNITY): Payer: Self-pay | Admitting: Psychiatry

## 2021-08-15 ENCOUNTER — Inpatient Hospital Stay (HOSPITAL_COMMUNITY)
Admission: AD | Admit: 2021-08-15 | Discharge: 2021-08-17 | DRG: 885 | Disposition: A | Discharge status: Home / Self Care | Payer: Federal, State, Local not specified - Other | Source: Intra-hospital | Attending: Emergency Medicine | Admitting: Emergency Medicine

## 2021-08-15 ENCOUNTER — Telehealth (HOSPITAL_COMMUNITY): Payer: Self-pay

## 2021-08-15 DIAGNOSIS — F332 Major depressive disorder, recurrent severe without psychotic features: Principal | ICD-10-CM | POA: Diagnosis present

## 2021-08-15 DIAGNOSIS — Z560 Unemployment, unspecified: Secondary | ICD-10-CM | POA: Diagnosis not present

## 2021-08-15 DIAGNOSIS — Z818 Family history of other mental and behavioral disorders: Secondary | ICD-10-CM

## 2021-08-15 DIAGNOSIS — M940 Chondrocostal junction syndrome [Tietze]: Secondary | ICD-10-CM | POA: Diagnosis present

## 2021-08-15 DIAGNOSIS — F1290 Cannabis use, unspecified, uncomplicated: Secondary | ICD-10-CM

## 2021-08-15 DIAGNOSIS — Z91199 Patient's noncompliance with other medical treatment and regimen due to unspecified reason: Secondary | ICD-10-CM | POA: Diagnosis not present

## 2021-08-15 DIAGNOSIS — F4310 Post-traumatic stress disorder, unspecified: Secondary | ICD-10-CM | POA: Diagnosis present

## 2021-08-15 DIAGNOSIS — T391X2A Poisoning by 4-Aminophenol derivatives, intentional self-harm, initial encounter: Secondary | ICD-10-CM | POA: Diagnosis not present

## 2021-08-15 DIAGNOSIS — Z91419 Personal history of unspecified adult abuse: Secondary | ICD-10-CM | POA: Diagnosis not present

## 2021-08-15 DIAGNOSIS — Z630 Problems in relationship with spouse or partner: Secondary | ICD-10-CM | POA: Diagnosis not present

## 2021-08-15 DIAGNOSIS — Z9151 Personal history of suicidal behavior: Secondary | ICD-10-CM

## 2021-08-15 DIAGNOSIS — F1220 Cannabis dependence, uncomplicated: Secondary | ICD-10-CM

## 2021-08-15 DIAGNOSIS — F322 Major depressive disorder, single episode, severe without psychotic features: Secondary | ICD-10-CM | POA: Diagnosis not present

## 2021-08-15 MED ORDER — TRAZODONE HCL 50 MG PO TABS
50.0000 mg | ORAL_TABLET | Freq: Every evening | ORAL | Status: DC | PRN
  Administered 2021-08-16: 50 mg via ORAL
  Filled 2021-08-15: qty 7
  Filled 2021-08-15: qty 1

## 2021-08-15 MED ORDER — HYDROXYZINE HCL 25 MG PO TABS
25.0000 mg | ORAL_TABLET | Freq: Three times a day (TID) | ORAL | Status: DC | PRN
  Filled 2021-08-15: qty 1
  Filled 2021-08-15: qty 10

## 2021-08-15 MED ORDER — MAGNESIUM HYDROXIDE 400 MG/5ML PO SUSP: 30.0000 mL | Freq: Every day | ORAL | Status: DC | PRN

## 2021-08-15 MED ORDER — ALUM & MAG HYDROXIDE-SIMETH 200-200-20 MG/5ML PO SUSP: 30.0000 mL | ORAL | Status: DC | PRN

## 2021-08-15 MED ORDER — ACETAMINOPHEN 325 MG PO TABS: 650.0000 mg | ORAL_TABLET | Freq: Four times a day (QID) | ORAL | Status: DC | PRN

## 2021-08-15 NOTE — ED Provider Notes (Signed)
FBC/OBS ASAP Discharge Summary  Date and Time: 08/15/2021 9:53 AM  Name: Caroline Gutierrez  MRN:  [id number]   Discharge Diagnoses:  Final diagnoses:  MDD (major depressive disorder), severe (HCC)  Suicide attempt [facility])    Subjective: [name], 25 y.o., female patient seen face to face by this provider, chart reviewed and discussed with Dr. Bronwen Betters  on 08/15/21 Patient has no past psychiatric history who presented to the [facility] ED with SA via intentional tylenol overdose. Patient was medically cleared in the ED and transferred to the [facility] for further treatment. Patient requested to leave and was IVC'd by Dr Bronwen Betters on 08/14/2021 due to danger for self and recent SA with plan and intent to need her life. She was then transferred to the Continuous assessment unit while waiting for inpatient psychiatric admission. Per Dr. Bronwen Betters progress note on 08/14/2021  "Patient states "I took pills because I did not want to be in this world".  On evaluation PENI RUPARD reports "I just want to go home I want to se my kids". Patient is cooperative but anxious. She is withdrawn and has her arms folded and looking down at the floor. She has a depressed affect. She asked if she could go home. Explained to patient she would be transferred to [facility] for inpatient psychiatric admission. Patient be came agitated states, " I want to go home, I want to see my kids, I got stuff to do". Provided reassurance and support. She is alert/oriented x 4.  She does not appear to be responding to internal/external stimuli. She denies SI/HI/AVH.   Patient is not forthcoming with questions, she wants to be discharged.   Stay Summary: Patient continues to meet requirements for inpatient psychiatric admission. She continues to meet IVC criteria.   Total Time spent with patient: 15 minutes  Past Psychiatric History: see h&p Past Medical History:  Past Medical History:  Diagnosis Date  . Medical history non-contributory      Past Surgical History:  Procedure Laterality Date  . NO PAST SURGERIES     Family History:  Family History  Problem Relation Age of Onset  . Cancer Mother        ovarian   Family Psychiatric History: See H&p Social History:  Social History   Substance and Sexual Activity  Alcohol Use No     Social History   Substance and Sexual Activity  Drug Use No    Social History   Socioeconomic History  . Marital status: Single    Spouse name: Not on file  . Number of children: Not on file  . Years of education: Not on file  . Highest education level: Not on file  Occupational History  . Not on file  Tobacco Use  . Smoking status: Never  . Smokeless tobacco: Never  Substance and Sexual Activity  . Alcohol use: No  . Drug use: No  . Sexual activity: Not Currently    Birth control/protection: None  Other Topics Concern  . Not on file  Social History Narrative  . Not on file   Social Determinants of Health   Financial Resource Strain: Not on file  Food Insecurity: Not on file  Transportation Needs: Not on file  Physical Activity: Not on file  Stress: Not on file  Social Connections: Not on file   SDOH:  SDOH Screenings   Alcohol Screen: Not on file  Depression (RJJ8-8): Not on file  Financial Resource Strain: Not on file  Food Insecurity: Not on file  Housing: Not on file  Physical Activity: Not on file  Social Connections: Not on file  Stress: Not on file  Tobacco Use: Low Risk   . Smoking Tobacco Use: Never  . Smokeless Tobacco Use: Never  . Passive Exposure: Not on file  Transportation Needs: Not on file    Tobacco Cessation:  N/A, patient does not currently use tobacco products  Current Medications:  Current Facility-Administered Medications  Medication Dose Route Frequency Provider Last Rate Last Admin  . alum & mag hydroxide-simeth (MAALOX/MYLANTA) 200-200-20 MG/5ML suspension 30 mL  30 mL Oral Q4H PRN Ajibola, Ene A, NP      . hydrOXYzine  (ATARAX/VISTARIL) tablet 25 mg  25 mg Oral TID PRN Ajibola, Ene A, NP      . magnesium hydroxide (MILK OF MAGNESIA) suspension 30 mL  30 mL Oral Daily PRN Ajibola, Ene A, NP      . traZODone (DESYREL) tablet 50 mg  50 mg Oral QHS PRN Ajibola, Ene A, NP       No current outpatient medications on file.    PTA Medications: (Not in a hospital admission)   Musculoskeletal  Strength & Muscle Tone: within normal limits Gait & Station: normal Patient leans: N/A  Psychiatric Specialty Exam  Presentation  General Appearance: Appropriate for Environment; Casual  Eye Contact:Fleeting  Speech:Clear and Coherent; Normal Rate  Speech Volume:Normal  Handedness:Right   Mood and Affect  Mood:Anxious; Depressed  Affect:Congruent   Thought Process  Thought Processes:Coherent  Descriptions of Associations:Intact  Orientation:Full (Time, Place and Person)  Thought Content:Logical  Diagnosis of Schizophrenia or Schizoaffective disorder in past: No    Hallucinations:Hallucinations: None  Ideas of Reference:None  Suicidal Thoughts:Suicidal Thoughts: No SI Active Intent and/or Plan: Without Plan  Homicidal Thoughts:Homicidal Thoughts: No   Sensorium  Memory:Immediate Good; Recent Good; Remote Good  Judgment:Impaired  Insight:Poor   Executive Functions  Concentration:Fair  Attention Span:Fair  Recall:Good  Fund of Knowledge:Good  Language:Good   Psychomotor Activity  Psychomotor Activity:Psychomotor Activity: Normal   Assets  Assets:Financial Resources/Insurance; Housing; Physical Health; Resilience; Social Support   Sleep  Sleep:Sleep: Fair   Nutritional Assessment (For OBS and FBC admissions only) Has the patient had a weight loss or gain of 10 pounds or more in the last 3 months?: No Has the patient had a decrease in food intake/or appetite?: No Does the patient have dental problems?: No Has the patient recently lost weight without trying?: 0 Has the  patient been eating poorly because of a decreased appetite?: 0 Malnutrition Screening Tool Score: 0    Physical Exam  Physical Exam Vitals and nursing note reviewed.  Constitutional:      General: She is not in acute distress.    Appearance: Normal appearance. She is well-developed. She is not ill-appearing.  HENT:     Head: Normocephalic and atraumatic.  Eyes:     General:        Right eye: No discharge.        Left eye: No discharge.     Conjunctiva/sclera: Conjunctivae normal.  Cardiovascular:     Rate and Rhythm: Normal rate.  Pulmonary:     Effort: Pulmonary effort is normal. No respiratory distress.  Musculoskeletal:        General: Normal range of motion.     Cervical back: Normal range of motion.  Skin:    Coloration: Skin is not jaundiced or pale.  Neurological:     Mental Status: She  is alert and oriented to person, place, and time.  Psychiatric:        Attention and Perception: Attention and perception normal.        Mood and Affect: Mood is anxious and depressed.        Speech: Speech normal.        Behavior: Behavior is agitated and withdrawn.        Thought Content: Thought content normal.        Cognition and Memory: Cognition normal.        Judgment: Judgment is impulsive.   Review of Systems  Constitutional: Negative.   HENT: Negative.    Eyes: Negative.   Respiratory: Negative.    Cardiovascular: Negative.   Genitourinary: Negative.   Musculoskeletal: Negative.   Skin: Negative.   Psychiatric/Behavioral:  Positive for depression. The patient is nervous/anxious.   Blood pressure 106/65, pulse 92, temperature 98.6 F (37 C), temperature source Oral, resp. rate 16, last menstrual period 07/20/2021, SpO2 100 %, unknown if currently breastfeeding. There is no height or weight on file to calculate BMI.  Demographic Factors:  Adolescent or young adult and Low socioeconomic status  Loss Factors: Financial problems/change in socioeconomic  status  Historical Factors: Impulsivity  Risk Reduction Factors:   Responsible for children under 12 years of age, Sense of responsibility to family, Living with another person, especially a relative, Positive social support, Positive therapeutic relationship, and Positive coping skills or problem solving skills  Continued Clinical Symptoms:  Severe Anxiety and/or Agitation Depression:   Hopelessness Impulsivity  Cognitive Features That Contribute To Risk:  None    Suicide Risk:  Severe:  Frequent, intense, and enduring suicidal ideation, specific plan, no subjective intent, but some objective markers of intent (i.e., choice of lethal method), the method is accessible, some limited preparatory behavior, evidence of impaired self-control, severe dysphoria/symptomatology, multiple risk factors present, and few if any protective factors, particularly a lack of social support.  Plan Of Care/Follow-up recommendations:  Activity:  as tolerated  Diet:  regular   Disposition: Discharge and readmit patient to [facility].Patient will be transported via safe transport.   Ardis Hughs, NP 08/15/2021, 9:53 AM

## 2021-08-15 NOTE — Discharge Instructions (Addendum)
Transfer to [facility] for inpatient psychiatric admission  °

## 2021-08-15 NOTE — ED Notes (Signed)
Patient to be transferred to [facility]. 
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
## 2021-08-15 NOTE — BH Assessment (Signed)
Care Management - Follow Up Discharges   Patient has been placed in an inpatient psychiatric hospital [facility] ) on 08-15-21

## 2021-08-15 NOTE — Tx Team (Signed)
Initial Treatment Plan 08/15/2021 1:35 PM Caroline Gutierrez DVO:[id number]    PATIENT STRESSORS: Not being with daughter Taking care of daughter Loss of job Relationship problems with daughter's father   PATIENT STRENGTHS: Physical Health    PATIENT IDENTIFIED PROBLEMS: Loss of job  SUA  Relationship problems / poor support from daughter's father                 DISCHARGE CRITERIA:  Ability to meet basic life and health needs Adequate post-discharge living arrangements Improved stabilization in mood, thinking, and/or behavior  PRELIMINARY DISCHARGE PLAN: Attend aftercare/continuing care group Participate in family therapy Return to previous living arrangement  PATIENT/FAMILY INVOLVEMENT: This treatment plan has been presented to and reviewed with the patient, Caroline Gutierrez,.  The patient has been given the opportunity to ask questions and make suggestions.  Wardell Heath, RN 08/15/2021, 1:35 PM

## 2021-08-15 NOTE — Progress Notes (Signed)
Pt accepted to [facility] 402-2   Patient meets inpatient criteria per Earlene Plater, MD       The attending provider will be Bartholomew Crews, MD  Call report to [phone]    Jenean Lindau, RN @ [facility] notified.     Pt scheduled  to arrive at [facility] TODAY between 1030-1100.    Damita Dunnings, MSW, LCSW-A  9:55 AM 08/15/2021

## 2021-08-15 NOTE — ED Notes (Signed)
Patient is awake and alert.  Soft spoken and able to make needs known.  Denies avh shi or plan.  Appears sad with constricted affect.  Encouraged to make needs known.  Will monitor and provide safe environment.

## 2021-08-15 NOTE — Progress Notes (Signed)
Caroline Gutierrez was visible on the unit.  She attended evening group.  She rated her day 7/10 (10 the best).  She reported her anxiety and depression were 0/10 (10 the worst).  She declined any HS medications.  She denied SI/HI or AVH.  She is minimal with staff but was noted having a conversation with her roommate.  She is currently resting with her eyes closed and appears to be asleep.  Q 15 minute checks maintained for safety.     08/15/21 2130  Psych Admission Type (Psych Patients Only)  Admission Status Involuntary  Psychosocial Assessment  Patient Complaints Depression  Eye Contact Fair  Facial Expression Sad  Affect Depressed;Sad  Speech Soft;Logical/coherent  Interaction Guarded  Motor Activity Other (Comment) (unremarkable)  Appearance/Hygiene Unremarkable  Behavior Characteristics Cooperative;Appropriate to situation  Mood Depressed  Thought Process  Coherency WDL  Content WDL  Delusions WDL  Perception WDL  Hallucination None reported or observed  Judgment Impaired  Confusion WDL  Danger to Self  Current suicidal ideation? Denies  Danger to Others  Danger to Others None reported or observed

## 2021-08-15 NOTE — ED Notes (Signed)
Pt is sleeping in the bed. Respirations are even and unlabored. No acute distress noted. Will continue to monitor for safety. °

## 2021-08-15 NOTE — [facility] Group Notes (Signed)
[facility] Group Notes:  (Nursing/MHT/Case Management/Adjunct)  Date:  08/15/2021  Time:  8:31 PM  Type of Therapy:  Psychoeducational Skills  Participation Level:  Minimal  Participation Quality:  Attentive  Affect:  Appropriate  Cognitive:  Appropriate  Insight:  Improving  Engagement in Group:  Engaged  Modes of Intervention:  Education  Summary of Progress/Problems: The patient rated her day as a 7 out of 10 since she misses her daughter. Her goal for tomorrow is to work more on "self love".   Hazle Coca S 08/15/2021, 8:31 PM

## 2021-08-15 NOTE — ED Notes (Signed)
Pt currently awake denies needs of any kind at this time. Minimal response to questions eye contacted avoided pt biting finger nails. Affect flat facial expressions nearing tears remains nearly mute during interaction with staff.
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
## 2021-08-16 ENCOUNTER — Encounter (HOSPITAL_COMMUNITY): Payer: Self-pay

## 2021-08-16 DIAGNOSIS — F1220 Cannabis dependence, uncomplicated: Secondary | ICD-10-CM

## 2021-08-16 LAB — HEPATIC FUNCTION PANEL
ALT: 29 U/L (ref 0–44)
AST: 21 U/L (ref 15–41)
Albumin: 4.5 g/dL (ref 3.5–5.0)
Alkaline Phosphatase: 58 U/L (ref 38–126)
Bilirubin, Direct: 0.2 mg/dL (ref 0.0–0.2)
Indirect Bilirubin: 1.2 mg/dL — ABNORMAL HIGH (ref 0.3–0.9)
Total Bilirubin: 1.4 mg/dL — ABNORMAL HIGH (ref 0.3–1.2)
Total Protein: 8.3 g/dL — ABNORMAL HIGH (ref 6.5–8.1)

## 2021-08-16 MED ORDER — ACETAMINOPHEN 325 MG PO TABS
650.0000 mg | ORAL_TABLET | Freq: Four times a day (QID) | ORAL | Status: DC | PRN
  Administered 2021-08-17: 650 mg via ORAL
  Filled 2021-08-16: qty 2

## 2021-08-16 MED ORDER — WHITE PETROLATUM EX OINT
TOPICAL_OINTMENT | CUTANEOUS | Status: AC
  Filled 2021-08-16: qty 5

## 2021-08-16 NOTE — BHH Group Notes (Signed)
Adult Psychoeducational Group Note  Date:  08/16/2021 Time:  4:53 PM  Group Topic/Focus:  Relaxation  Participation Level:  Active  Participation Quality:  Appropriate  Affect:  Appropriate  Cognitive:  Appropriate  Insight: Appropriate  Engagement in Group:  Engaged  Modes of Intervention:  Activity   Donell Beers 08/16/2021, 4:53 PM

## 2021-08-16 NOTE — BH IP Treatment Plan (Signed)
Interdisciplinary Treatment and Diagnostic Plan Update  08/16/2021 Time of Session: 9:15am  HENCHY MCCAULEY MRN: [id number]  Principal Diagnosis: MDD (major depressive disorder), recurrent severe, without psychosis ([place])  Secondary Diagnoses: Principal Problem:   MDD (major depressive disorder), recurrent severe, without psychosis ([place]) Active Problems:   PTSD (post-traumatic stress disorder)   Current Medications:  Current Facility-Administered Medications  Medication Dose Route Frequency Provider Last Rate Last Admin   alum & mag hydroxide-simeth (MAALOX/MYLANTA) 200-200-20 MG/5ML suspension 30 mL  30 mL Oral Q4H PRN Revonda Humphrey, NP       hydrOXYzine (ATARAX/VISTARIL) tablet 25 mg  25 mg Oral TID PRN Revonda Humphrey, NP       magnesium hydroxide (MILK OF MAGNESIA) suspension 30 mL  30 mL Oral Daily PRN Revonda Humphrey, NP       traZODone (DESYREL) tablet 50 mg  50 mg Oral QHS PRN Revonda Humphrey, NP       PTA Medications: No medications prior to admission.    Patient Stressors:    Patient Strengths: Physical Health   Treatment Modalities: Medication Management, Group therapy, Case management,  1 to 1 session with clinician, Psychoeducation, Recreational therapy.   Physician Treatment Plan for Primary Diagnosis: MDD (major depressive disorder), recurrent severe, without psychosis ([place]) Long Term Goal(s): Improvement in symptoms so as ready for discharge   Short Term Goals: Ability to identify changes in lifestyle to reduce recurrence of condition will improve Ability to verbalize feelings will improve Ability to disclose and discuss suicidal ideas Ability to demonstrate self-control will improve Ability to identify and develop effective coping behaviors will improve Ability to maintain clinical measurements within normal limits will improve Compliance with prescribed medications will improve Ability to identify triggers associated with substance  abuse/mental health issues will improve  Medication Management: Evaluate patient's response, side effects, and tolerance of medication regimen.  Therapeutic Interventions: 1 to 1 sessions, Unit Group sessions and Medication administration.  Evaluation of Outcomes: Not Met  Physician Treatment Plan for Secondary Diagnosis: Principal Problem:   MDD (major depressive disorder), recurrent severe, without psychosis ([place]) Active Problems:   PTSD (post-traumatic stress disorder)  Long Term Goal(s): Improvement in symptoms so as ready for discharge   Short Term Goals: Ability to identify changes in lifestyle to reduce recurrence of condition will improve Ability to verbalize feelings will improve Ability to disclose and discuss suicidal ideas Ability to demonstrate self-control will improve Ability to identify and develop effective coping behaviors will improve Ability to maintain clinical measurements within normal limits will improve Compliance with prescribed medications will improve Ability to identify triggers associated with substance abuse/mental health issues will improve     Medication Management: Evaluate patient's response, side effects, and tolerance of medication regimen.  Therapeutic Interventions: 1 to 1 sessions, Unit Group sessions and Medication administration.  Evaluation of Outcomes: Not Met   RN Treatment Plan for Primary Diagnosis: MDD (major depressive disorder), recurrent severe, without psychosis ([place]) Long Term Goal(s): Knowledge of disease and therapeutic regimen to maintain health will improve  Short Term Goals: Ability to remain free from injury will improve, Ability to participate in decision making will improve, Ability to verbalize feelings will improve, Ability to disclose and discuss suicidal ideas, and Ability to identify and develop effective coping behaviors will improve  Medication Management: RN will administer medications as ordered by provider,  will assess and evaluate patient's response and provide education to patient for prescribed medication. RN will report any adverse and/or side effects  to prescribing provider.  Therapeutic Interventions: 1 on 1 counseling sessions, Psychoeducation, Medication administration, Evaluate responses to treatment, Monitor vital signs and CBGs as ordered, Perform/monitor CIWA, COWS, AIMS and Fall Risk screenings as ordered, Perform wound care treatments as ordered.  Evaluation of Outcomes: Not Met   LCSW Treatment Plan for Primary Diagnosis: MDD (major depressive disorder), recurrent severe, without psychosis ([place]) Long Term Goal(s): Safe transition to appropriate next level of care at discharge, Engage patient in therapeutic group addressing interpersonal concerns.  Short Term Goals: Engage patient in aftercare planning with referrals and resources, Increase social support, Increase emotional regulation, Facilitate acceptance of mental health diagnosis and concerns, Identify triggers associated with mental health/substance abuse issues, and Increase skills for wellness and recovery  Therapeutic Interventions: Assess for all discharge needs, 1 to 1 time with Social worker, Explore available resources and support systems, Assess for adequacy in community support network, Educate family and significant other(s) on suicide prevention, Complete Psychosocial Assessment, Interpersonal group therapy.  Evaluation of Outcomes: Not Met   Progress in Treatment: Attending groups: Yes. Participating in groups: Yes. Taking medication as prescribed: Yes. Toleration medication: Yes. Family/Significant other contact made: Yes, individual(s) contacted:  Mother  Patient understands diagnosis: Yes. Discussing patient identified problems/goals with staff: Yes. Medical problems stabilized or resolved: Yes. Denies suicidal/homicidal ideation: Yes. Issues/concerns per patient self-inventory: No.   New problem(s)  identified: No, Describe:  None   New Short Term/Long Term Goal(s): medication stabilization, elimination of SI thoughts, development of comprehensive mental wellness plan.   Patient Goals: "To get out of the hospital and to be with my family"   Discharge Plan or Barriers: Patient recently admitted. CSW will continue to follow and assess for appropriate referrals and possible discharge planning.   Reason for Continuation of Hospitalization: Depression Medication stabilization Suicidal ideation  Estimated Length of Stay: 3 to 5 days    Scribe for Treatment Team: Darleen Crocker, Latanya Presser 08/16/2021 2:11 PM

## 2021-08-16 NOTE — Group Note (Signed)
LCSW Group Therapy Note   Group Date: 08/16/2021 Start Time: 1300 End Time: 1400  Type of Therapy and Topic:  Group Therapy: Worry    Participation Level: Active   Description of Group:   In this group, patients learned how to define worry. Patients were asked to identify a time they felt anxiety or something that triggers their anxiety. Patients engaged in a matching interactive activity matching bugs, with each match facts about worry was shared. Patients and CSW engaged in discussion surrounding each fact / definition shared. Patients were then asked to explore positive coping mechanisms for worry and discussed things like unrealistic worry, things out of our control and times that they worried about something but actually ended up enjoying it (I.e. Learning to ride a bike). Patients discussed several new ways to handle worry such as music, journaling, thinking about positive endings instead of negative, drawing, happy place, relaxation skills (deep breathing, progressive muscle relaxation and meditation) and engaging in a hobby. CSW asked patients to commit to trying a positive coping skill when faced with worry in the future.   Therapeutic Goals: Patients will recall a time they felt worried or identify something they worry about often. Patients will learn how to define worry.  Patients will learn that some worry cannot be erased, as some things are out of out control and at times our worries are not likely to happen.  Patients will be asked to do some self-reflection with their own worry (throughout the matching activity). Patients will be asked to practice impulse control with the matching game and imagery with the happy place during this group.   Patients were asked to share ways they can cope with anxiety.       Summary of Patient Progress:  Pt came to group and shared during introductions that she is worried her child will get taken away. Pt was appropriate during discussion and  completed her worksheet.      Therapeutic Modalities:   Cognitive Behavioral Therapy Motivational Interviewing  Brief Therapy   Chrys Racer 08/16/2021  1:38 PM

## 2021-08-16 NOTE — BHH Group Notes (Signed)
Adult Psychoeducational Group Note  Date:  08/16/2021 Time:  8:54 AM  Group Topic/Focus:  Goals Group:   The focus of this group is to help patients establish daily goals to achieve during treatment and discuss how the patient can incorporate goal setting into their daily lives to aide in recovery.  Participation Level:  Active  Participation Quality:  Appropriate  Affect:  Appropriate  Cognitive:  Appropriate  Insight: Appropriate  Engagement in Group:  Engaged  Modes of Intervention:  Discussion  Additional Comments:  Pt goals; Be positive  to be able to go home tomorrow to be with my daughter   Donell Beers 08/16/2021, 8:54 AM
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
## 2021-08-16 NOTE — B[facility] Suicide Risk Assessment (Signed)
[facility] INPATIENT:  Family/Significant Other Suicide Prevention Education  Suicide Prevention Education:  Education Completed; Amador Cunas [phone] (Mother) has been identified by the patient as the family member/significant other with whom the patient will be residing, and identified as the person(s) who will aid the patient in the event of a mental health crisis (suicidal ideations/suicide attempt).  With written consent from the patient, the family member/significant other has been provided the following suicide prevention education, prior to the and/or following the discharge of the patient.  The suicide prevention education provided includes the following: Suicide risk factors Suicide prevention and interventions National Suicide Hotline telephone number [facility] assessment telephone number [facility] Emergency Assistance 911 [facility] and/or Residential Mobile Crisis Unit telephone number  Request made of family/significant other to: Remove weapons (e.g., guns, rifles, knives), all items previously/currently identified as safety concern.   Remove drugs/medications (over-the-counter, prescriptions, illicit drugs), all items previously/currently identified as a safety concern.  The family member/significant other verbalizes understanding of the suicide prevention education information provided.  The family member/significant other agrees to remove the items of safety concern listed above.  CSW spoke with Mrs. Laural Benes who states that her daughter has been having difficulties with her boyfriend and that those difficulties led to her feeling suicidal and being admitted to the hospital.  She states that she has been an emotional and financial support for her daughter and will continue to do so.  She states that she will be coming to [employer] this weekend to pick up her daughter and the minor child to take them both back to [place] with her.  Mrs. Laural Benes states that  her daughter has no known firearms or weapons in the home.  CSW completed SPE with Mrs. Laural Benes.   Metro Kung Jessicamarie Amiri 08/16/2021, 3:45 PM

## 2021-08-16 NOTE — Progress Notes (Addendum)
[facility] MD Progress Note  08/16/2021 1:40 PM Caroline Gutierrez  MRN:  [id number] Subjective:  Patient is a 25 year old female with history of costochondritis and depression presents to [facility] involuntarily from [facility] after suicide attempt.   Chart Review, 24 hr Events: The patient's chart was reviewed and nursing notes were reviewed. The patient's case was discussed in multidisciplinary team meeting.  Per MAR: -Patient refused starting psychotropic medications - Agitation PRNs: none Per RN notes, no documented behavioral issues and is attending group. Patient slept, 5.5 hours  TODAY'S INTERVIEW Patient seen and staffed with attending Dr. Caswell Corwin.  Patient reports that she is doing well today. She reports that her mood is 'fine' and denies feeling depressed or having anhedonia.  Patient denies any suicidal or homicidal thoughts.  Patient reports that she is planning to return to [place] with daughter to be with mom.  Patient reports she has more social supports there which would allow her to better manage her mental health.  Discussed that patient should speak to social worker regarding this. Pt reports that they are better able to set her up with mental health providers including therapy in [place].  Patient verbalized agreement.  Patient continues to decline prescribing of psychotropic medications as she feels that she does not need them.  Patient is very eager for discharge as she feels as if she has been away from daughter for some time now.  Discussed plan to continue to monitor patient for any mood instability.  Encourage patient to attend group as much as possible with plan for discharge likely tomorrow or day after.  Patient verbalized agreement and is hopeful that her discharge to be tomorrow.  Principal Problem: MDD (major depressive disorder), recurrent severe, without psychosis ([place]) Diagnosis: Principal Problem:   MDD (major depressive disorder), recurrent severe, without psychosis  ([place]) Active Problems:   PTSD (post-traumatic stress disorder)  Past Psychiatric History: see H&P  Past Medical History:  Past Medical History:  Diagnosis Date   Medical history non-contributory     Past Surgical History:  Procedure Laterality Date   NO PAST SURGERIES     Family History:  Family History  Problem Relation Age of Onset   Cancer Mother        ovarian   Family Psychiatric  History: see H&P Social History:  Social History   Substance and Sexual Activity  Alcohol Use No     Social History   Substance and Sexual Activity  Drug Use Yes   Types: Marijuana    Social History   Socioeconomic History   Marital status: Single    Spouse name: Not on file   Number of children: 1   Years of education: 12   Highest education level: High school graduate  Occupational History   Not on file  Tobacco Use   Smoking status: Never   Smokeless tobacco: Never  Substance and Sexual Activity   Alcohol use: No   Drug use: Yes    Types: Marijuana   Sexual activity: Not Currently    Birth control/protection: None  Other Topics Concern   Not on file  Social History Narrative   Not on file   Social Determinants of Health   Financial Resource Strain: Not on file  Food Insecurity: Not on file  Transportation Needs: Not on file  Physical Activity: Not on file  Stress: Not on file  Social Connections: Not on file   Additional Social History:        Sleep: Fair  Appetite:  Fair  Current Medications: Current Facility-Administered Medications  Medication Dose Route Frequency Provider Last Rate Last Admin   alum & mag hydroxide-simeth (MAALOX/MYLANTA) 200-200-20 MG/5ML suspension 30 mL  30 mL Oral Q4H PRN Revonda Humphrey, NP       hydrOXYzine (ATARAX/VISTARIL) tablet 25 mg  25 mg Oral TID PRN Revonda Humphrey, NP       magnesium hydroxide (MILK OF MAGNESIA) suspension 30 mL  30 mL Oral Daily PRN Revonda Humphrey, NP       traZODone (DESYREL) tablet 50 mg   50 mg Oral QHS PRN Revonda Humphrey, NP        Lab Results: No results found for this or any previous visit (from the past 52 hour(s)).  Blood Alcohol level:  Lab Results  Component Value Date   ETH <10 08/13/2021     Physical Findings:   Musculoskeletal: Strength & Muscle Tone: within normal limits Gait & Station: normal Patient leans: N/A  Psychiatric Specialty Exam:  Presentation  General Appearance: Appropriate for Environment; Casual; Fairly Groomed  Eye Contact:Good  Speech:Clear and Coherent; Normal Rate  Speech Volume:Normal  Handedness:Right   Mood and Affect  Mood:euthymic   Affect:Congruent; Full Range   Thought Process  Thought Processes:Coherent; Linear; Goal Directed  Descriptions of Associations:Intact  Orientation:Full (Time, Place and Person)  Thought Content:Logical  History of Schizophrenia/Schizoaffective disorder:No  Duration of Psychotic Symptoms:No data recorded Hallucinations:Hallucinations: None  Ideas of Reference:None  Suicidal Thoughts:Suicidal Thoughts: No  Homicidal Thoughts:Homicidal Thoughts: No   Sensorium  Memory:Immediate Fair; Recent Fair; Remote Fair  Judgment:fair   Insight:Fair   Executive Functions  Concentration:Fair  Attention Span:Fair  Curtis  Language:Good   Psychomotor Activity  Psychomotor Activity:Psychomotor Activity: Normal   Assets  Assets:Financial Resources/Insurance; Housing; Physical Health; Resilience; Social Support   Sleep  Sleep:Sleep: Good Number of Hours of Sleep: 5.5    Physical Exam: Physical Exam Vitals and nursing note reviewed.  Constitutional:      Appearance: Normal appearance. She is normal weight.  HENT:     Head: Normocephalic and atraumatic.  Pulmonary:     Effort: Pulmonary effort is normal.  Neurological:     General: No focal deficit present.     Mental Status: She is oriented to person, place, and time.    Review of Systems  Respiratory:  Negative for shortness of breath.   Cardiovascular:  Negative for chest pain.  Gastrointestinal:  Negative for abdominal pain, constipation, diarrhea, heartburn, nausea and vomiting.  Neurological:  Negative for headaches.   Blood pressure 106/73, pulse 65, temperature 98 F (36.7 C), temperature source Oral, resp. rate 16, height 5\' 4"  (1.626 m), weight 51.1 kg, last menstrual period 07/20/2021, SpO2 100 %, unknown if currently breastfeeding. Body mass index is 19.33 kg/m.   Treatment Plan Summary: Daily contact with patient to assess and evaluate symptoms and progress in treatment and Medication management  ASSESSMENT Patient is a 25 year old female with history of costochondritis and depression presents to [facility]arily from [facility] due to suicide attempt and noncompliance to receive psychiatric care.  Patient currently refusing any psychotropic medications.  Encouraged patient to attend group and participate in her care.  MDD PTSD Cannabis use disorder   PLAN Safety and Monitoring: Involuntary admission to inpatient psychiatric unit for safety, stabilization and treatment Daily contact with patient to assess and evaluate symptoms and progress in treatment Patient's case to be discussed in multi-disciplinary team meeting Observation Level : q15  minute checks Vital signs: q12 hours Precautions: suicide, elopement, and assault     Psychiatric Problems Major depressive disorder-severe, recurrent episode without psychotic features Posttraumatic stress disorder -Patient has refused all psychotropic medications at this time -Encourage patient to attend group therapy while hospitalized   Medical Problems Cannabis use disorder    PRNs Maalox/Mylanta 30 mL for indigestion Hydroxyzine 25 mg tid for anxiety Milk of Magnesia 30 mL for constipation Trazodone 50 mg for sleep   4. Discharge Planning: Social work and case management to assist  with discharge planning and identification of hospital follow-up needs prior to discharge Estimated LOS: 3-5 days Discharge Concerns: Need to establish a safety plan; Medication compliance and effectiveness Discharge Goals: Return home with outpatient referrals for mental health follow-up including medication management/psychotherapy  Park Pope, MD 08/16/2021, 1:40 PM  Total Time Spent in Direct Patient Care:  I personally spent 30 minutes on the unit in direct patient care. The direct patient care time included face-to-face time with the patient, reviewing the patient's chart, communicating with other professionals, and coordinating care. Greater than 50% of this time was spent in counseling or coordinating care with the patient regarding goals of hospitalization, psycho-education, and discharge planning needs.  I have independently evaluated the patient during a face-to-face assessment on 08/16/21. I reviewed the patient's chart, and I participated in key portions of the service. I discussed the case with the [employer], and I agree with the assessment and plan of care as documented in the [employer] note, as addended by me or notated below:  Agree with note and plan.  Discharge planning for tomorrow.    Phineas Inches, MD Psychiatrist

## 2021-08-16 NOTE — BHH Counselor (Addendum)
CSW contacted [facility] Services (CPS) [phone] and filed a report due to the Pt's report of a suicide attempt in the presence of the minor child and the report that the minor child was left alone in the home for an unknown amount of time.  CSW also provided information about the mother and minor child possibly moving to [place] in the near future with the maternal grandmother. The CPS case worker who completed the intake report states that she will call the CSW back if there are are any other questions or concerns needed for the case to move forward.  CSW provided the CPS worker with her name and phone number in case follow-up is needed.

## 2021-08-16 NOTE — BHH Group Notes (Signed)
[facility] Group Notes:  (Nursing/MHT/Case Management/Adjunct)  Date:  08/16/2021  Time:  8:27 PM  Type of Therapy:  NA Group  Participation Level:  Minimal  Participation Quality:  Attentive  Affect:  Appropriate  Cognitive:  Appropriate  Insight:  Appropriate, Good, and Improving  Engagement in Group:  Developing/Improving  Modes of Intervention:  Discussion  Summary of Progress/Problems: PT was in a neutral mood. She was attentive and positive in group.  Lorita Officer 08/16/2021, 8:27 PM

## 2021-08-17 DIAGNOSIS — F1290 Cannabis use, unspecified, uncomplicated: Secondary | ICD-10-CM

## 2021-08-17 NOTE — Progress Notes (Signed)
D: Pt A & O X 4. Denies SI, HI, AVH and pain at this time. D/C home as ordered. Picked up in front of facility by [employer].  A: D/C instructions reviewed with pt including prescriptions, medication samples and follow up appointment, compliance encouraged. All belongings from assigned locker returned to pt at time of departure. Scheduled medications given with verbal education and effects monitored. Safety checks maintained without incident till time of d/c.  R: Pt receptive to care. Compliant with medications when offered. Denies adverse drug reactions when assessed. Verbalized understanding related to d/c instructions. Signed belonging sheet in agreement with items received from locker. Ambulatory with a steady gait. Appears to be in no physical distress at time of departure.

## 2021-08-17 NOTE — Discharge Summary (Signed)
Physician Discharge Summary Note  Patient:  Caroline Gutierrez is an 25 y.o., female MRN:  [id number] DOB:  March 16, 1996 Patient phone:  [phone] (home)  Patient address:   [address] [place] [address],  Total Time spent with patient: 30 minutes I personally spent 30 minutes on the unit in direct patient care. The direct patient care time included face-to-face time with the patient, reviewing the patient's chart, communicating with other professionals, and coordinating care. Greater than 50% of this time was spent in counseling or coordinating care with the patient regarding goals of hospitalization, psycho-education, and discharge planning needs.  Date of Admission:  08/15/2021 Date of Discharge: 08/17/21  Reason for Admission:  Patient is a 25 year old female with history of costochondritis and depression presents to [facility] involuntarily from [facility] due to suicide attempt.  Per chart review, patient has had recent stressors including losing her job last Friday, child's father "dating another girl", and taking care of 14-year-old child.  Patient was initially transferred from [facility] long ED to [facility] for psychiatric stabilization.  However after 1 day, patient requested to leave even though she was not psychiatrically stable and unwilling to get stay voluntarily.  Principal Problem: MDD (major depressive disorder), recurrent severe, without psychosis (HCC) Discharge Diagnoses: Principal Problem:   MDD (major depressive disorder), recurrent severe, without psychosis (HCC) Active Problems:   PTSD (post-traumatic stress disorder)   Cannabis use disorder   Past Psychiatric History: Previous Psych Diagnoses: none Prior inpatient treatment: none Current/prior outpatient treatment: none Prior rehab hx: none Psychotherapy hx: none History of suicide attempt: none prior to this admission History of homicidal attempt: none Psychiatric medication history: lexapro Psychiatric medication  compliance history: none Current Psychiatrist: none Current therapist: none  Past Medical History:  Past Medical History:  Diagnosis Date   Medical history non-contributory     Past Surgical History:  Procedure Laterality Date   NO PAST SURGERIES     Family History:  Family History  Problem Relation Age of Onset   Cancer Mother        ovarian   Family Psychiatric  History: Patient reports mother's side and has bipolar schizophrenia Social History:  Social History   Substance and Sexual Activity  Alcohol Use No     Social History   Substance and Sexual Activity  Drug Use Yes   Types: Marijuana    Social History   Socioeconomic History   Marital status: Single    Spouse name: Not on file   Number of children: 1   Years of education: 12   Highest education level: High school graduate  Occupational History   Not on file  Tobacco Use   Smoking status: Never   Smokeless tobacco: Never  Substance and Sexual Activity   Alcohol use: No   Drug use: Yes    Types: Marijuana   Sexual activity: Not Currently    Birth control/protection: None  Other Topics Concern   Not on file  Social History Narrative   Not on file   Social Determinants of Health   Financial Resource Strain: Not on file  Food Insecurity: Not on file  Transportation Needs: Not on file  Physical Activity: Not on file  Stress: Not on file  Social Connections: Not on file    Hospital Course:  On admission patient was integrated into therapeutic milieu and placed on appropriate precautions.  Pt declined starting psychiatric medications on day of admission, and subsequently each day during admission.  Patient prefers supportive therapy  and group therapy while on the unit, and is looking forward to starting outpatient psychotherapy at discharge. Pt's mood, sleep, affect, SI and anxiety improved while her time at the inpatient unit. Pt was seen daily on rounds, and case discussed at multidisciplinary  team meeting to ensure biopsychosocial approach to treatment. . Pt was also educated on need to abstain from use of any drugs. During her stay Patient did not display any dangerous, violent or suicidal behavior on the unit.  Pt interacted with patients & staff appropriately, participated appropriately in the group sessions/therapies.  Pt was recommended for outpatient follow-up care with outpatient psychiatric provider, outpatient psychotherapist, and PCP to assure her continuity of care.   At the time of discharge, patient is not reporting any acute suicidal/homicidal ideations. Pt reports improved mood, sleep and anxiety. Pt currently denies any new issues or concerns. Education and supportive counseling provided throughout her hospital stay & upon discharge.   Physical Findings: AIMS: Facial and Oral Movements Muscles of Facial Expression: None, normal Lips and Perioral Area: None, normal Jaw: None, normal Tongue: None, normal,Extremity Movements Upper (arms, wrists, hands, fingers): None, normal Lower (legs, knees, ankles, toes): None, normal, Trunk Movements Neck, shoulders, hips: None, normal, Overall Severity Severity of abnormal movements (highest score from questions above): None, normal Incapacitation due to abnormal movements: None, normal Patient's awareness of abnormal movements (rate only patient's report): No Awareness, Dental Status Current problems with teeth and/or dentures?: No Does patient usually wear dentures?: No  CIWA:    COWS:     Musculoskeletal: Strength & Muscle Tone: within normal limits Gait & Station: normal Patient leans: N/A   Psychiatric Specialty Exam:  Presentation  General Appearance: Appropriate for Environment; Casual; Fairly Groomed  Eye Contact:Good  Speech:Clear and Coherent; Normal Rate  Speech Volume:Normal  Handedness:Right   Mood and Affect  Mood:Euthymic  Affect:Appropriate; Congruent; Full Range   Thought Process  Thought  Processes:Coherent; Linear  Descriptions of Associations:Intact  Orientation:Full (Time, Place and Person)  Thought Content:Logical  History of Schizophrenia/Schizoaffective disorder:No  Duration of Psychotic Symptoms:No data recorded Hallucinations:Hallucinations: None  Ideas of Reference:None  Suicidal Thoughts:Suicidal Thoughts: No  Homicidal Thoughts:Homicidal Thoughts: No   Sensorium  Memory:Immediate Good; Recent Good; Remote Good  Judgment:Good  Insight:Good   Executive Functions  Concentration:Good  Attention Span:Good  [place] of Knowledge:Good  Language:Good   Psychomotor Activity  Psychomotor Activity:Psychomotor Activity: Normal   Assets  Assets:Financial Resources/Insurance; Housing; Physical Health; Resilience; Social Support   Sleep  Sleep:Sleep: Good Number of Hours of Sleep: 5.5    Physical Exam: Physical Exam Vitals and nursing note reviewed.  Constitutional:      General: She is not in acute distress.    Appearance: Normal appearance. She is not ill-appearing, toxic-appearing or diaphoretic.  HENT:     Head: Normocephalic and atraumatic.  Pulmonary:     Effort: Pulmonary effort is normal.  Neurological:     General: No focal deficit present.     Mental Status: She is alert and oriented to person, place, and time.   Review of Systems  Constitutional:  Negative for chills and fever.  Eyes:  Negative for blurred vision.  Respiratory:  Negative for cough and shortness of breath.   Cardiovascular:  Negative for chest pain.  Gastrointestinal:  Negative for abdominal pain, diarrhea, nausea and vomiting.  Neurological:  Negative for dizziness and headaches.  Psychiatric/Behavioral:  Negative for depression, hallucinations and suicidal ideas. The patient is not nervous/anxious.   Blood pressure 100/76,  pulse 92, temperature 98.2 F (36.8 C), temperature source Oral, resp. rate 16, height 5\' 4"  (1.626 m), weight 51.1 kg,  last menstrual period 07/20/2021, SpO2 100 %, unknown if currently breastfeeding. Body mass index is 19.33 kg/m.   Social History   Tobacco Use  Smoking Status Never  Smokeless Tobacco Never   Tobacco Cessation:  N/A, patient does not currently use tobacco products   Blood Alcohol level:  Lab Results  Component Value Date   ETH <10 [id number]    Metabolic Disorder Labs:  No results found for: HGBA1C, MPG No results found for: PROLACTIN No results found for: CHOL, TRIG, HDL, CHOLHDL, VLDL, LDLCALC  See Psychiatric Specialty Exam and Suicide Risk Assessment completed by Attending Physician prior to discharge.  Discharge destination:  Home  Is patient on multiple antipsychotic therapies at discharge:  No   Has Patient had three or more failed trials of antipsychotic monotherapy by history:  No  Recommended Plan for Multiple Antipsychotic Therapies: NA  Discharge Instructions     Diet - low sodium heart healthy   Complete by: As directed    Increase activity slowly   Complete by: As directed       Allergies as of 08/17/2021   No Known Allergies      Medication List    You have not been prescribed any medications.     [place]. Go to.   Specialty: Behavioral Health Why: Please go to this provider for therapy and medication management services during walk in hours:  Monday through Wednesday, from 7:45 am to 11:00 am.  Services are provided on a first come, first served basis; please arrive early. Contact information: [place] [phone]                Follow-up recommendations:  Activity:  As Tolerated Diet:  low salt heart healthy  Comments:   Patient agreeable to plan.  Given opportunity to ask questions.  Appears to feel comfortable with discharge denies any current suicidal or homicidal thought. Patient has been instructed & cautioned: To not engage  in alcohol and or illegal drug use while on prescription medicines. In the event of worsening symptoms, patient is instructed to call the crisis hotline, 911 and or go to the nearest ED for appropriate evaluation and treatment of symptoms. To follow-up with her primary care provider for your other medical issues, concerns and or health care needs.   Signed: Armando Reichert, MD 08/17/2021, 2:27 PM

## 2021-08-17 NOTE — Progress Notes (Signed)
  [facility] Adult Case Management Discharge Plan :  Will you be returning to the same living situation after discharge:  Yes,  to home At discharge, do you have transportation home?: No. Safe Transport to be arranged Do you have the ability to pay for your medications: No. Samples to be provided at discharge  Release of information consent forms completed and in the chart;  Patient's signature needed at discharge.  Patient to Follow up at:  Follow-up Information     [place] [facility]. Go to.   Specialty: Behavioral Health Why: Please go to this provider for therapy and medication management services during walk in hours:  Monday through Wednesday, from 7:45 am to 11:00 am.  Services are provided on a first come, first served basis; please arrive early. Contact information: [address] [place] [address] [phone]                Next level of care provider has access to [facility] Link:no  Safety Planning and Suicide Prevention discussed: Yes,  with mother     Has patient been referred to the Quitline?: N/A patient is not a smoker  Patient has been referred for addiction treatment: N/A  Otelia Santee, LCSW 08/17/2021, 10:21 AM
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
## 2021-08-17 NOTE — BHH Group Notes (Signed)
Pt. Particpated in group. Pt. Stated that she is grateful for her child. Pt. Was social with peers and staff.

## 2021-08-17 NOTE — Progress Notes (Signed)
Caroline Gutierrez was pleasant and cooperative this evening.  She attended evening NA group.  She rated her day as 8/10.  Denied any anxiety or depression.  She expressed that she is hoping to go home today to "see my child."  She did take prn trazodone for sleep with good results. She is currently resting with her eyes closed and appears to be asleep.  Q 15 minute checks maintained for safety.   08/16/21 2159  Psych Admission Type (Psych Patients Only)  Admission Status Involuntary  Psychosocial Assessment  Patient Complaints Sadness;Insomnia  Eye Contact Fair  Facial Expression Sad  Affect Depressed;Sad  Speech Soft;Logical/coherent  Interaction Assertive  Motor Activity Other (Comment) (Unremarkable)  Appearance/Hygiene Unremarkable  Behavior Characteristics Cooperative;Appropriate to situation  Mood Anxious  Thought Process  Coherency Circumstantial  Content WDL  Delusions None reported or observed  Perception WDL  Hallucination None reported or observed  Judgment Impaired  Confusion None  Danger to Self  Current suicidal ideation? Denies  Danger to Others  Danger to Others None reported or observed

## 2023-04-23 IMAGING — DX DG CHEST 2V
2 series · 2 of 2 positions shown · non-contrast
Comparison: None.

CLINICAL DATA: Anterior chest wall pain and shortness of breath.

EXAM:
CHEST - 2 VIEW

[chest pa]
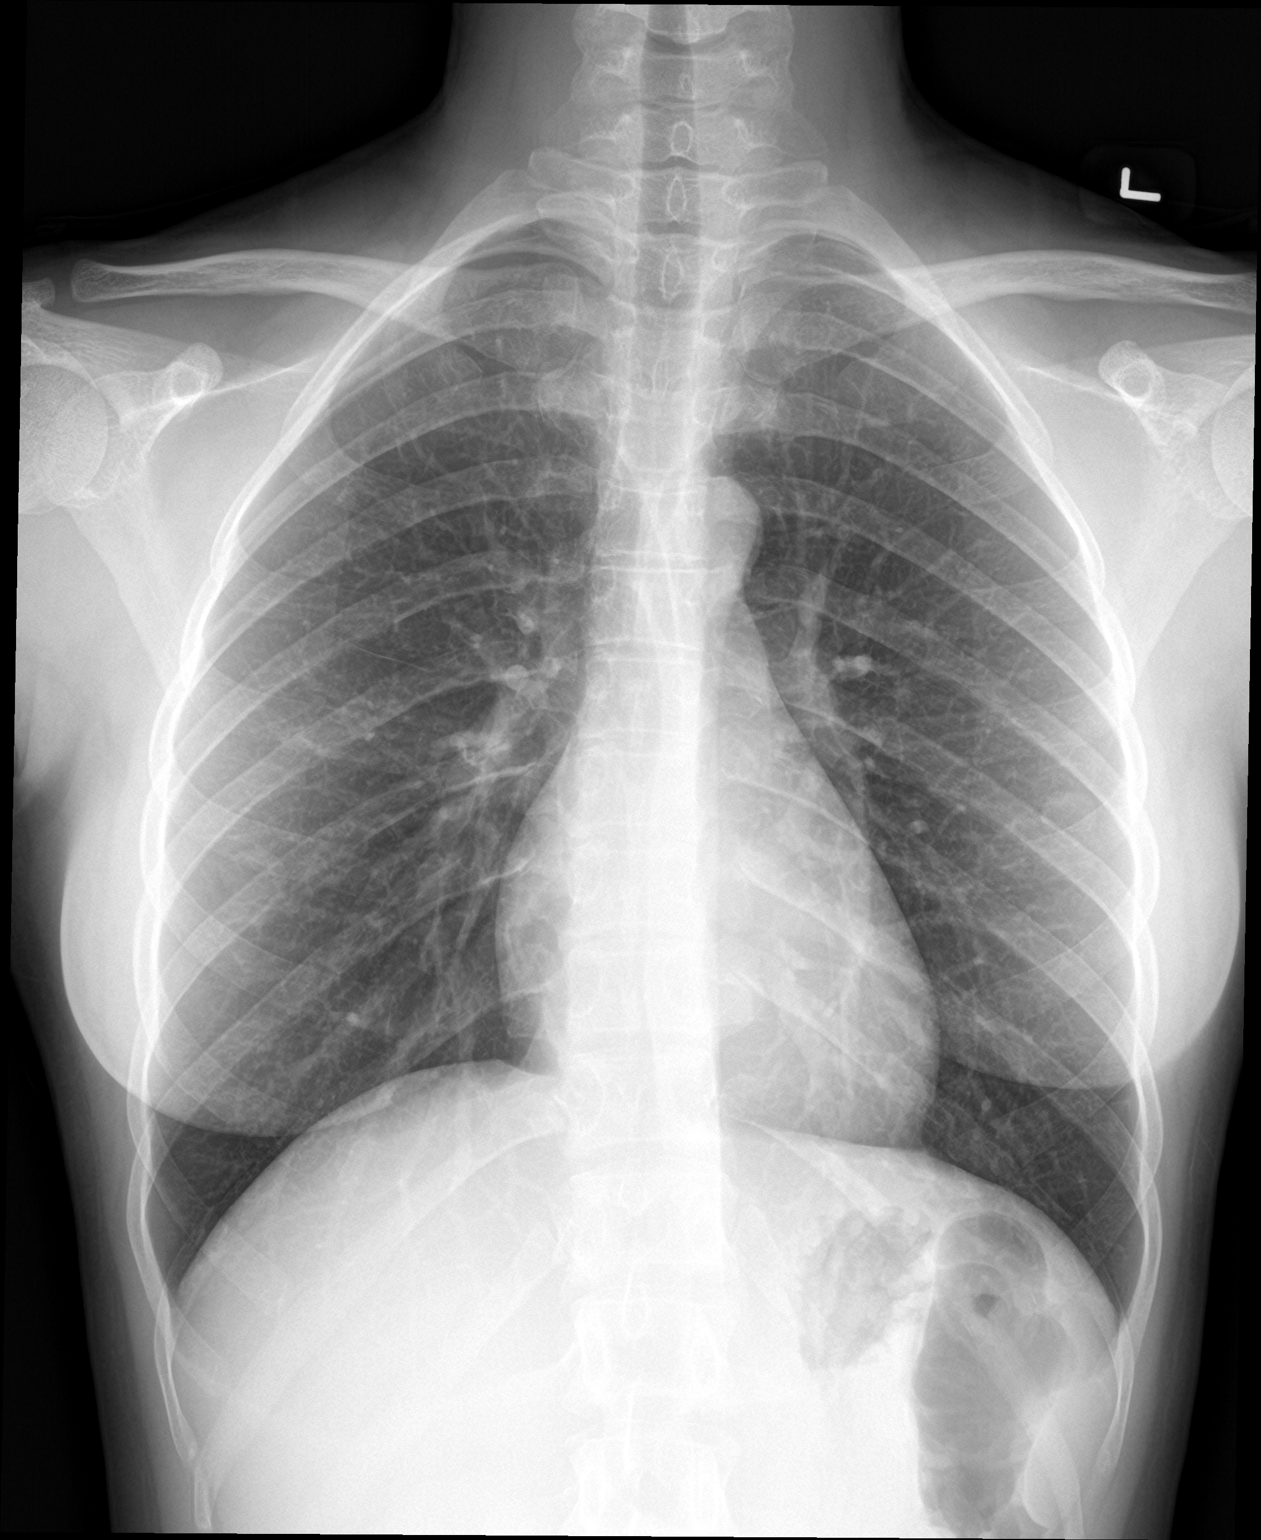

[chest lat]
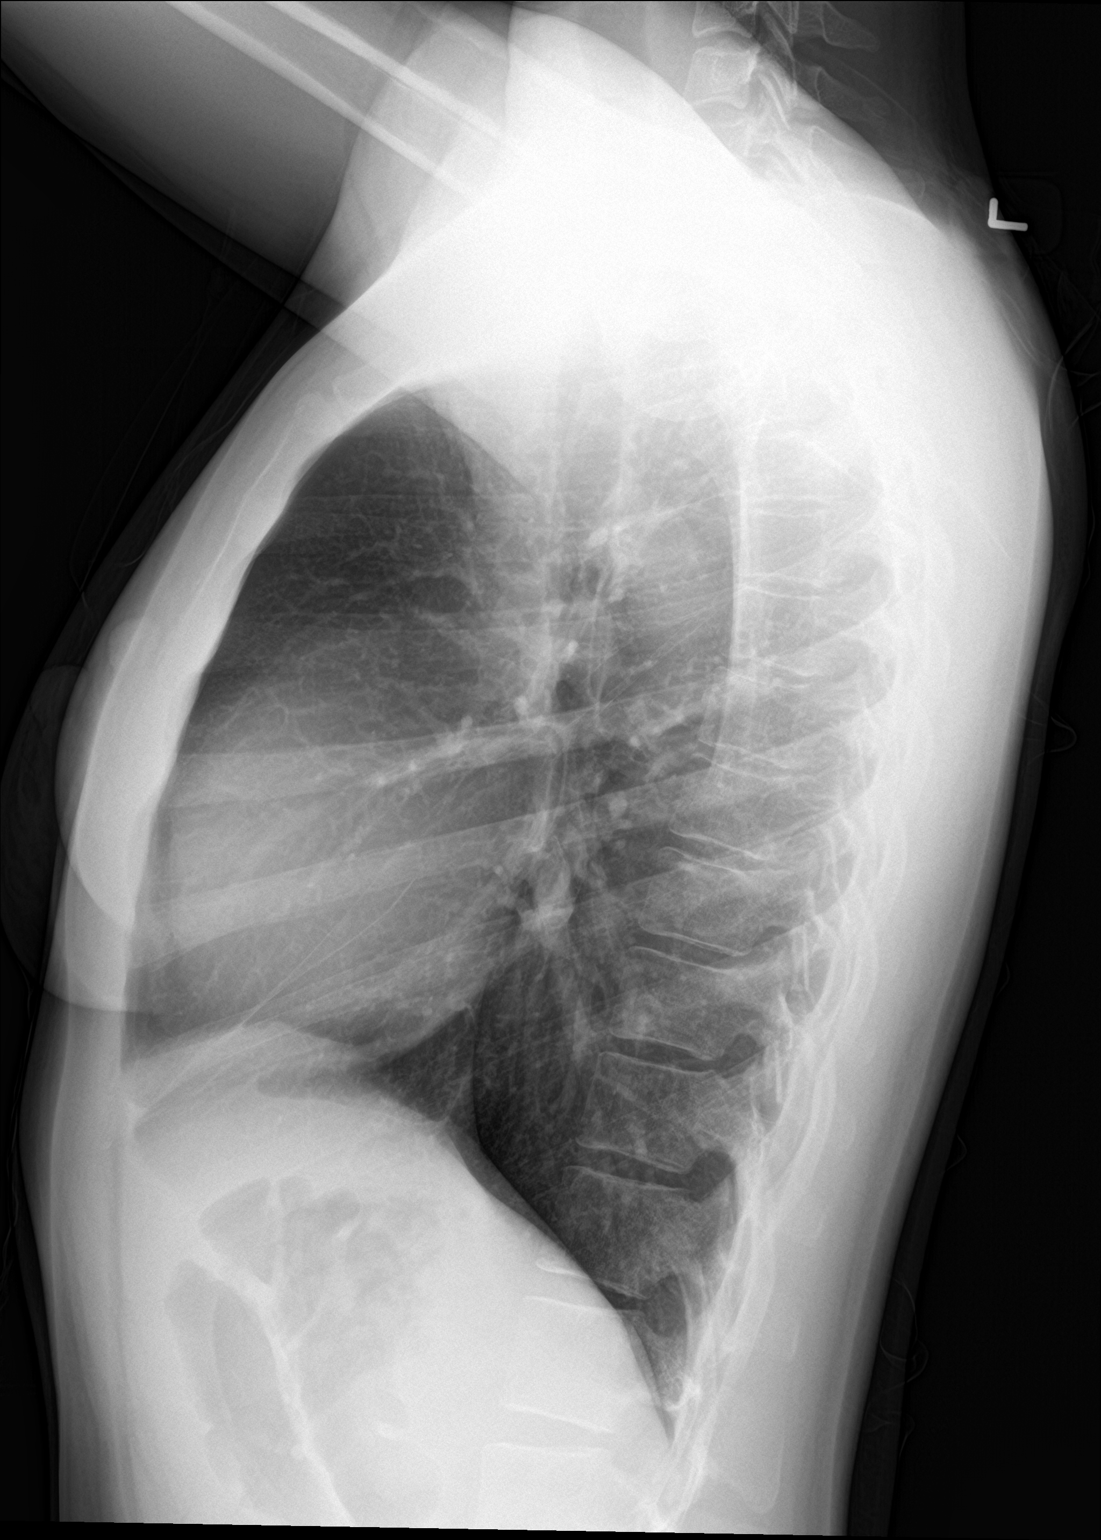

[2 of 2 positions shown; findings below may reference images not displayed]

FINDINGS: The heart size and mediastinal contours are within normal limits.
Both lungs are clear. The visualized skeletal structures are
unremarkable.
IMPRESSION: No active cardiopulmonary disease.
